# Patient Record
Sex: Male | Born: 1940
Health system: Southern US, Community
[De-identification: ages and names within clinical notes are randomized; demographics above are authoritative.]

## PROBLEM LIST (undated history)

## (undated) DIAGNOSIS — E119 Type 2 diabetes mellitus without complications: Secondary | ICD-10-CM

## (undated) DIAGNOSIS — I10 Essential (primary) hypertension: Secondary | ICD-10-CM

## (undated) DIAGNOSIS — Z8719 Personal history of other diseases of the digestive system: Secondary | ICD-10-CM

## (undated) DIAGNOSIS — Z8711 Personal history of peptic ulcer disease: Secondary | ICD-10-CM

## (undated) HISTORY — DX: Type 2 diabetes mellitus without complications: E11.9

## (undated) HISTORY — DX: Personal history of other diseases of the digestive system: Z87.19

## (undated) HISTORY — DX: Essential (primary) hypertension: I10

## (undated) HISTORY — DX: Personal history of peptic ulcer disease: Z87.11

---

## 2005-05-11 ENCOUNTER — Ambulatory Visit: Payer: Self-pay | Admitting: Family Medicine

## 2005-06-08 ENCOUNTER — Ambulatory Visit: Payer: Self-pay | Admitting: Family Medicine

## 2005-07-12 ENCOUNTER — Ambulatory Visit: Payer: Self-pay | Admitting: Gastroenterology

## 2005-07-26 ENCOUNTER — Encounter (INDEPENDENT_AMBULATORY_CARE_PROVIDER_SITE_OTHER): Payer: Self-pay | Admitting: *Deleted

## 2005-07-26 ENCOUNTER — Ambulatory Visit: Payer: Self-pay | Admitting: Gastroenterology

## 2007-10-19 ENCOUNTER — Encounter: Payer: Self-pay | Admitting: Family Medicine

## 2008-06-07 ENCOUNTER — Ambulatory Visit: Payer: Self-pay | Admitting: Family Medicine

## 2008-06-07 DIAGNOSIS — I10 Essential (primary) hypertension: Secondary | ICD-10-CM | POA: Insufficient documentation

## 2008-06-07 DIAGNOSIS — Z8711 Personal history of peptic ulcer disease: Secondary | ICD-10-CM

## 2008-06-10 ENCOUNTER — Telehealth: Payer: Self-pay | Admitting: Family Medicine

## 2008-06-10 LAB — CONVERTED CEMR LAB
Albumin: 4 g/dL (ref 3.5–5.2)
BUN: 13 mg/dL (ref 6–23)
Basophils Relative: 0.4 % (ref 0.0–3.0)
Calcium: 9.6 mg/dL (ref 8.4–10.5)
Creatinine, Ser: 1.1 mg/dL (ref 0.4–1.5)
Eosinophils Absolute: 0.1 10*3/uL (ref 0.0–0.7)
Eosinophils Relative: 1.9 % (ref 0.0–5.0)
GFR calc Af Amer: 86 mL/min
GFR calc non Af Amer: 71 mL/min
Glucose, Bld: 231 mg/dL — ABNORMAL HIGH (ref 70–99)
HCT: 44.8 % (ref 39.0–52.0)
Hemoglobin: 15 g/dL (ref 13.0–17.0)
Hgb A1c MFr Bld: 9.3 % — ABNORMAL HIGH (ref 4.6–6.0)
MCV: 88.1 fL (ref 78.0–100.0)
Monocytes Absolute: 0.6 10*3/uL (ref 0.1–1.0)
Monocytes Relative: 8.8 % (ref 3.0–12.0)
Neutro Abs: 3.8 10*3/uL (ref 1.4–7.7)
Platelets: 284 10*3/uL (ref 150–400)
Potassium: 4.4 meq/L (ref 3.5–5.1)
TSH: 1.03 microintl units/mL (ref 0.35–5.50)
Total Protein: 7.3 g/dL (ref 6.0–8.3)
WBC: 6.8 10*3/uL (ref 4.5–10.5)

## 2008-06-27 ENCOUNTER — Encounter (INDEPENDENT_AMBULATORY_CARE_PROVIDER_SITE_OTHER): Payer: Self-pay | Admitting: *Deleted

## 2008-07-16 ENCOUNTER — Ambulatory Visit: Payer: Self-pay | Admitting: Family Medicine

## 2008-07-17 LAB — CONVERTED CEMR LAB
ALT: 14 units/L (ref 0–53)
BUN: 11 mg/dL (ref 6–23)
Basophils Relative: 0.2 % (ref 0.0–3.0)
CO2: 30 meq/L (ref 19–32)
Chloride: 105 meq/L (ref 96–112)
Cholesterol: 177 mg/dL (ref 0–200)
Creatinine, Ser: 1 mg/dL (ref 0.4–1.5)
Eosinophils Absolute: 0 10*3/uL (ref 0.0–0.7)
Eosinophils Relative: 0.4 % (ref 0.0–5.0)
HCT: 43.9 % (ref 39.0–52.0)
LDL Cholesterol: 123 mg/dL — ABNORMAL HIGH (ref 0–99)
Lymphs Abs: 1.6 10*3/uL (ref 0.7–4.0)
MCHC: 33.5 g/dL (ref 30.0–36.0)
MCV: 89.3 fL (ref 78.0–100.0)
Microalb, Ur: 1.8 mg/dL (ref 0.0–1.9)
Monocytes Absolute: 0.4 10*3/uL (ref 0.1–1.0)
Neutrophils Relative %: 62.6 % (ref 43.0–77.0)
PSA: 2.15 ng/mL (ref 0.10–4.00)
Platelets: 277 10*3/uL (ref 150.0–400.0)
Potassium: 3.9 meq/L (ref 3.5–5.1)
RBC: 4.92 M/uL (ref 4.22–5.81)
TSH: 1.11 microintl units/mL (ref 0.35–5.50)
Total Protein: 7.6 g/dL (ref 6.0–8.3)
Triglycerides: 71 mg/dL (ref 0.0–149.0)
WBC: 5.6 10*3/uL (ref 4.5–10.5)

## 2008-09-04 ENCOUNTER — Encounter: Payer: Self-pay | Admitting: Family Medicine

## 2008-09-25 ENCOUNTER — Ambulatory Visit: Payer: Self-pay | Admitting: Family Medicine

## 2008-12-25 ENCOUNTER — Ambulatory Visit: Payer: Self-pay | Admitting: Family Medicine

## 2008-12-26 LAB — CONVERTED CEMR LAB
Creatinine,U: 154.5 mg/dL
Microalb Creat Ratio: 7.8 mg/g (ref 0.0–30.0)

## 2009-07-17 ENCOUNTER — Ambulatory Visit: Payer: Self-pay | Admitting: Family Medicine

## 2009-07-18 ENCOUNTER — Encounter (INDEPENDENT_AMBULATORY_CARE_PROVIDER_SITE_OTHER): Payer: Self-pay

## 2009-07-18 LAB — CONVERTED CEMR LAB
AST: 30 units/L (ref 0–37)
BUN: 10 mg/dL (ref 6–23)
Basophils Absolute: 0 10*3/uL (ref 0.0–0.1)
Bilirubin, Direct: 0 mg/dL (ref 0.0–0.3)
Calcium: 9.3 mg/dL (ref 8.4–10.5)
Cholesterol: 205 mg/dL — ABNORMAL HIGH (ref 0–200)
Creatinine, Ser: 1 mg/dL (ref 0.4–1.5)
GFR calc non Af Amer: 95.05 mL/min (ref >60)
Glucose, Bld: 131 mg/dL — ABNORMAL HIGH (ref 70–99)
HCT: 41.3 % (ref 39.0–52.0)
HDL: 52.1 mg/dL (ref >39.00)
Hgb A1c MFr Bld: 6.3 % (ref 4.6–6.5)
Lymphocytes Relative: 37.7 % (ref 12.0–46.0)
Lymphs Abs: 2.1 10*3/uL (ref 0.7–4.0)
Microalb Creat Ratio: 10.8 mg/g (ref 0.0–30.0)
Monocytes Relative: 10.9 % (ref 3.0–12.0)
Neutrophils Relative %: 50 % (ref 43.0–77.0)
Platelets: 282 10*3/uL (ref 150.0–400.0)
RDW: 14.1 % (ref 11.5–14.6)
TSH: 0.74 microintl units/mL (ref 0.35–5.50)
Total Bilirubin: 0.7 mg/dL (ref 0.3–1.2)
Triglycerides: 108 mg/dL (ref 0.0–149.0)
VLDL: 21.6 mg/dL (ref 0.0–40.0)

## 2009-10-09 ENCOUNTER — Ambulatory Visit: Payer: Self-pay | Admitting: Family Medicine

## 2009-10-09 DIAGNOSIS — M25569 Pain in unspecified knee: Secondary | ICD-10-CM

## 2009-12-31 ENCOUNTER — Encounter: Payer: Self-pay | Admitting: Family Medicine

## 2010-01-21 ENCOUNTER — Encounter: Payer: Self-pay | Admitting: Family Medicine

## 2010-05-26 NOTE — Miscellaneous (Signed)
Summary: Rx  Clinical Lists Changes  Medications: Added new medication of ZOCOR 40 MG TABS (SIMVASTATIN) 1 once daily - Signed Rx of ZOCOR 40 MG TABS (SIMVASTATIN) 1 once daily;  #90 x 0;  Signed;  Entered by: Raechel Ache, RN;  Authorized by: Nelwyn Salisbury MD;  Method used: Print then Give to Patient    Prescriptions: ZOCOR 40 MG TABS (SIMVASTATIN) 1 once daily  #90 x 0   Entered by:   Raechel Ache, RN   Authorized by:   Nelwyn Salisbury MD   Signed by:   Raechel Ache, RN on 07/18/2009   Method used:   Print then Give to Patient   RxID:   [id number]  mailed to patient- unable to reach by phone.

## 2010-05-26 NOTE — Letter (Signed)
Summary: Request for change in Diabetic Testing Regimen  Request for change in Diabetic Testing Regimen   Imported By: Maryln Gottron 01/06/2010 13:03:57  _____________________________________________________________________  External Attachment:    Type:   Image     Comment:   External Document

## 2010-05-26 NOTE — Medication Information (Signed)
Summary: Order for Diabetic Testing Supplies  Order for Diabetic Testing Supplies   Imported By: Maryln Gottron 01/22/2010 13:33:15  _____________________________________________________________________  External Attachment:    Type:   Image     Comment:   External Document

## 2010-05-26 NOTE — Medication Information (Signed)
Summary: Order for Diabetic Testing Supplies  Order for Diabetic Testing Supplies   Imported By: Maryln Gottron 10/13/2009 13:56:12  _____________________________________________________________________  External Attachment:    Type:   Image     Comment:   External Document

## 2010-05-26 NOTE — Assessment & Plan Note (Signed)
Summary: emp/pt fasting/cjr   Vital Signs:  Patient profile:   70 year old male Weight:      174 pounds BMI:     25.42 Pulse rate:   84 / minute Pulse rhythm:   regular BP sitting:   180 / 90  (left arm) Cuff size:   regular  Vitals Entered By: Raechel Ache, RN (July 17, 2009 9:20 AM) CC: OV, fasting. Brought list of sugars- wants to get off Metformin. Is Patient Diabetic? Yes   CC:  OV and fasting. Brought list of sugars- wants to get off Metformin.Marland Kitchen  History of Present Illness: 70 yr old male for a cpx. He feels great and has no concerns. Still working at the [employer] His BP at home is steady in the range of 110-120 over 70-80. His am fasting glucoses are always from 90 to 110. The Metformin causes his stomach to burn at times, and he would like to stop this for awhile to see what happens.   Allergies: 1)  ! Penicillin  Past History:  Past Medical History: Reviewed history from 06/07/2008 and no changes required. Diabetes mellitus, type II Hypertension Stomach ulcers  Past Surgical History: Reviewed history from 07/16/2008 and no changes required. Denies surgical history colonoscopy 07-26-05 per Dr. Jarold Motto, repeat in 5  years  Family History: Reviewed history from 06/07/2008 and no changes required. Family History of CAD Male 1st degree relative <50 Family History of Colon CA 1st degree relative <60 Family History Diabetes 1st degree relative Family History High cholesterol Family History Hypertension Family History of Stroke M 1st degree relative <50  Social History: Reviewed history from 06/07/2008 and no changes required. Married Former smoker Alcohol use-no Drug use-no  Review of Systems  The patient denies anorexia, fever, weight loss, weight gain, vision loss, decreased hearing, hoarseness, chest pain, syncope, dyspnea on exertion, peripheral edema, prolonged cough, headaches, hemoptysis, abdominal pain, melena, hematochezia, severe  indigestion/heartburn, hematuria, incontinence, genital sores, muscle weakness, suspicious skin lesions, transient blindness, difficulty walking, depression, unusual weight change, abnormal bleeding, enlarged lymph nodes, angioedema, breast masses, and testicular masses.    Physical Exam  General:  Well-developed,well-nourished,in no acute distress; alert,appropriate and cooperative throughout examination Head:  Normocephalic and atraumatic without obvious abnormalities. No apparent alopecia or balding. Eyes:  No corneal or conjunctival inflammation noted. EOMI. Perrla. Funduscopic exam benign, without hemorrhages, exudates or papilledema. Vision grossly normal. Ears:  External ear exam shows no significant lesions or deformities.  Otoscopic examination reveals clear canals, tympanic membranes are intact bilaterally without bulging, retraction, inflammation or discharge. Hearing is grossly normal bilaterally. Nose:  External nasal examination shows no deformity or inflammation. Nasal mucosa are pink and moist without lesions or exudates. Mouth:  Oral mucosa and oropharynx without lesions or exudates.  Teeth in good repair. Neck:  No deformities, masses, or tenderness noted. Chest Wall:  No deformities, masses, tenderness or gynecomastia noted. Lungs:  Normal respiratory effort, chest expands symmetrically. Lungs are clear to auscultation, no crackles or wheezes. Heart:  Normal rate and regular rhythm. S1 and S2 normal without gallop, murmur, click, rub or other extra sounds. EKG normal Abdomen:  Bowel sounds positive,abdomen soft and non-tender without masses, organomegaly or hernias noted. Rectal:  No external abnormalities noted. Normal sphincter tone. No rectal masses or tenderness. Heme neg. Genitalia:  Testes bilaterally descended without nodularity, tenderness or masses. No scrotal masses or lesions. No penis lesions or urethral discharge. Prostate:  Prostate gland firm and smooth, no  enlargement, nodularity, tenderness,  mass, asymmetry or induration. Msk:  No deformity or scoliosis noted of thoracic or lumbar spine.   Pulses:  R and L carotid,radial,femoral,dorsalis pedis and posterior tibial pulses are full and equal bilaterally Extremities:  No clubbing, cyanosis, edema, or deformity noted with normal full range of motion of all joints.   Neurologic:  No cranial nerve deficits noted. Station and gait are normal. Plantar reflexes are down-going bilaterally. DTRs are symmetrical throughout. Sensory, motor and coordinative functions appear intact. Skin:  Intact without suspicious lesions or rashes Cervical Nodes:  No lymphadenopathy noted Axillary Nodes:  No palpable lymphadenopathy Inguinal Nodes:  No significant adenopathy Psych:  Cognition and judgment appear intact. Alert and cooperative with normal attention span and concentration. No apparent delusions, illusions, hallucinations   Impression & Recommendations:  Problem # 1:  WELL ADULT EXAM (ICD-V70.0)  Orders: Hemoccult Guaiac-1 spec.(in office) (82270) UA Dipstick w/o Micro (automated)  (81003) EKG w/ Interpretation (93000) Venipuncture (04540) TLB-Lipid Panel (80061-LIPID) TLB-BMP (Basic Metabolic Panel-BMET) (80048-METABOL) TLB-CBC Platelet - w/Differential (85025-CBCD) TLB-Hepatic/Liver Function Pnl (80076-HEPATIC) TLB-TSH (Thyroid Stimulating Hormone) (84443-TSH) TLB-PSA (Prostate Specific Antigen) (84153-PSA)  Complete Medication List: 1)  Metformin Hcl 500 Mg Tabs (Metformin hcl) .... Take 1 tab twice daily 2)  Bayer Contour Test Strp (Glucose blood) .... Once daily 3)  Freestyle Lancets Misc (Lancets) .... Once daily  Other Orders: Pneumococcal Vaccine (98119) Admin 1st Vaccine (14782) TLB-Microalbumin/Creat Ratio, Urine (82043-MALB) TLB-A1C / Hgb A1C (Glycohemoglobin) (83036-A1C)  Patient Instructions: 1)  get labs. We will stop the Metformin for one month. He will watch his glucoses closely  and let me know how they do.   Immunizations Administered:  Pneumonia Vaccine:    Vaccine Type: Pneumovax    Site: left deltoid    Mfr: Merck    Dose: 0.5 ml    Route: IM    Given by: Raechel Ache, RN    Exp. Date: 08/14/2010    Lot #: [id number]    VIS given: 11/22/95 version given July 17, 2009.   Appended Document: Orders Update    Clinical Lists Changes  Observations: Added new observation of COMMENTS: Wynona Canes, CMA  July 17, 2009 2:07 PM (07/17/2009 14:06) Added new observation of PH URINE: 5.5  (07/17/2009 14:06) Added new observation of SPEC GR URIN: 1.015  (07/17/2009 14:06) Added new observation of APPEARANCE U: Clear  (07/17/2009 14:06) Added new observation of UA COLOR: yellow  (07/17/2009 14:06) Added new observation of WBC DIPSTK U: negative  (07/17/2009 14:06) Added new observation of NITRITE URN: negative  (07/17/2009 14:06) Added new observation of UROBILINOGEN: negative  (07/17/2009 14:06) Added new observation of PROTEIN, URN: negative  (07/17/2009 14:06) Added new observation of BLOOD UR DIP: negative  (07/17/2009 14:06) Added new observation of KETONES URN: negative  (07/17/2009 14:06) Added new observation of BILIRUBIN UR: negative  (07/17/2009 14:06) Added new observation of GLUCOSE, URN: negative  (07/17/2009 14:06)      Laboratory Results   Urine Tests  Date/Time Recieved: July 17, 2009 2:07 PM Date/Time Reported: July 17, 2009 2:07 PM  Routine Urinalysis   Color: yellow Appearance: Clear Glucose: negative   (Normal Range: Negative) Bilirubin: negative   (Normal Range: Negative) Ketone: negative   (Normal Range: Negative) Spec. Gravity: 1.015   (Normal Range: 1.003-1.035) Blood: negative   (Normal Range: Negative) pH: 5.5   (Normal Range: 5.0-8.0) Protein: negative   (Normal Range: Negative) Urobilinogen: negative   (Normal Range: 0-1) Nitrite: negative   (Normal Range: Negative) Leukocyte Esterace: negative   (  Normal Range:  Negative)    Comments: Wynona Canes, CMA  July 17, 2009 2:07 PM

## 2010-05-26 NOTE — Assessment & Plan Note (Signed)
Summary: swollen knee/dm   Vital Signs:  Patient profile:   70 year old male Weight:      166 pounds BMI:     24.25 Temp:     98.2 degrees F oral BP sitting:   140 / 84  (left arm) Cuff size:   regular  Vitals Entered By: Raechel Ache, RN (October 09, 2009 9:34 AM) CC: C/o R knee pain & swelling.   History of Present Illness: Here for 2 weeks of swelling and pain in the right knee. No hx of trauma. Advil and ice packs help a little. He usually does not have trouble with any of his joints. He has been following his glucoses closely since stopping his Metformin a few months ago, and they are steady in the range of 110-120 fasting.   Allergies: 1)  ! Penicillin  Past History:  Past Medical History: Reviewed history from 06/07/2008 and no changes required. Diabetes mellitus, type II Hypertension Stomach ulcers  Past Surgical History: Reviewed history from 07/16/2008 and no changes required. Denies surgical history colonoscopy 07-26-05 per Dr. Jarold Motto, repeat in 5  years  Review of Systems  The patient denies anorexia, fever, weight loss, weight gain, vision loss, decreased hearing, hoarseness, chest pain, syncope, dyspnea on exertion, peripheral edema, prolonged cough, headaches, hemoptysis, abdominal pain, melena, hematochezia, severe indigestion/heartburn, hematuria, incontinence, genital sores, muscle weakness, suspicious skin lesions, transient blindness, difficulty walking, depression, unusual weight change, abnormal bleeding, enlarged lymph nodes, angioedema, breast masses, and testicular masses.    Physical Exam  General:  Well-developed,well-nourished,in no acute distress; alert,appropriate and cooperative throughout examination Lungs:  Normal respiratory effort, chest expands symmetrically. Lungs are clear to auscultation, no crackles or wheezes. Heart:  Normal rate and regular rhythm. S1 and S2 normal without gallop, murmur, click, rub or other extra  sounds. Extremities:  the right knee is mildly swollen and mildly tender in both joint spaces. ROM is full, no crepitus.    Impression & Recommendations:  Problem # 1:  KNEE PAIN (ICD-719.46)  His updated medication list for this problem includes:    Indomethacin 50 Mg Caps (Indomethacin) .Marland Kitchen... Three times a day as needed pain  Problem # 2:  HYPERTENSION (ICD-401.9)  Problem # 3:  DIABETES MELLITUS, TYPE II (ICD-250.00)  The following medications were removed from the medication list:    Metformin Hcl 500 Mg Tabs (Metformin hcl) .Marland Kitchen... Take 1 tab twice daily  Complete Medication List: 1)  Bayer Contour Test Strp (Glucose blood) .... Once daily 2)  Freestyle Lancets Misc (Lancets) .... Once daily 3)  Zocor 40 Mg Tabs (Simvastatin) .Marland Kitchen.. 1 once daily 4)  Indomethacin 50 Mg Caps (Indomethacin) .... Three times a day as needed pain  Patient Instructions: 1)  this may represent his first gout episode. Try Indomethacin.  2)  Please schedule a follow-up appointment as needed .  Prescriptions: INDOMETHACIN 50 MG CAPS (INDOMETHACIN) three times a day as needed pain  #60 x 5   Entered and Authorized by:   Nelwyn Salisbury MD   Signed by:   Nelwyn Salisbury MD on 10/09/2009   Method used:   Electronically to        [facility] [address] [phone]* (retail)       [address]       [place], [place]  [address]       Ph: [phone] or [phone]       Fax: [phone]  RxID:   [id number]

## 2010-09-17 ENCOUNTER — Encounter: Payer: Self-pay | Admitting: Gastroenterology

## 2012-01-12 ENCOUNTER — Encounter: Payer: Self-pay | Admitting: Gastroenterology

## 2012-04-04 ENCOUNTER — Ambulatory Visit (INDEPENDENT_AMBULATORY_CARE_PROVIDER_SITE_OTHER): Payer: Medicare Other | Admitting: Family Medicine

## 2012-04-04 ENCOUNTER — Encounter: Payer: Self-pay | Admitting: Family Medicine

## 2012-04-04 VITALS — BP 152/100 | HR 107 | Temp 98.3°F | Ht 69.5 in | Wt 170.0 lb

## 2012-04-04 DIAGNOSIS — Z23 Encounter for immunization: Secondary | ICD-10-CM

## 2012-04-04 DIAGNOSIS — E119 Type 2 diabetes mellitus without complications: Secondary | ICD-10-CM

## 2012-04-04 DIAGNOSIS — I10 Essential (primary) hypertension: Secondary | ICD-10-CM

## 2012-04-04 DIAGNOSIS — Z0000 Encounter for general adult medical examination without abnormal findings: Secondary | ICD-10-CM

## 2012-04-04 LAB — BASIC METABOLIC PANEL
CO2: 29 mEq/L (ref 19–32)
Chloride: 101 mEq/L (ref 96–112)
Sodium: 138 mEq/L (ref 135–145)

## 2012-04-04 LAB — CBC WITH DIFFERENTIAL/PLATELET
Basophils Absolute: 0 10*3/uL (ref 0.0–0.1)
Eosinophils Absolute: 0 10*3/uL (ref 0.0–0.7)
Lymphocytes Relative: 35.4 % (ref 12.0–46.0)
MCHC: 32.3 g/dL (ref 30.0–36.0)
Neutrophils Relative %: 54.9 % (ref 43.0–77.0)
RDW: 14.7 % — ABNORMAL HIGH (ref 11.5–14.6)

## 2012-04-04 LAB — HEPATIC FUNCTION PANEL
ALT: 19 U/L (ref 0–53)
AST: 25 U/L (ref 0–37)
Albumin: 4.2 g/dL (ref 3.5–5.2)

## 2012-04-04 LAB — LIPID PANEL
Cholesterol: 227 mg/dL — ABNORMAL HIGH (ref 0–200)
Total CHOL/HDL Ratio: 5

## 2012-04-04 LAB — LDL CHOLESTEROL, DIRECT: Direct LDL: 167.6 mg/dL

## 2012-04-04 NOTE — Addendum Note (Signed)
Addended by: Aniceto Boss A on: 04/04/2012 03:51 PM   Modules accepted: Orders

## 2012-04-04 NOTE — Progress Notes (Signed)
  Subjective:    Patient ID: Jose Fleming, male    DOB: 05-28-1940, 71 y.o.   MRN: [id number]  HPI 71 yr old male for a cpx. He feels well and has no concerns. He retired earlier this year, and he is enjoying his free time and the lack of stress in his life. He checks his glucoses at home in the mornings, and he usually gets in the 120s or 130s. His wife checks his BP at home, and they always get readings in the 130s over 80s.    Review of Systems  Constitutional: Negative.   HENT: Negative.   Eyes: Negative.   Respiratory: Negative.   Cardiovascular: Negative.   Gastrointestinal: Negative.   Genitourinary: Negative.   Musculoskeletal: Negative.   Skin: Negative.   Neurological: Negative.   Hematological: Negative.   Psychiatric/Behavioral: Negative.        Objective:   Physical Exam  Constitutional: He is oriented to person, place, and time. He appears well-developed and well-nourished. No distress.  HENT:  Head: Normocephalic and atraumatic.  Right Ear: External ear normal.  Left Ear: External ear normal.  Nose: Nose normal.  Mouth/Throat: Oropharynx is clear and moist. No oropharyngeal exudate.  Eyes: Conjunctivae normal and EOM are normal. Pupils are equal, round, and reactive to light. Right eye exhibits no discharge. Left eye exhibits no discharge. No scleral icterus.  Neck: Neck supple. No JVD present. No tracheal deviation present. No thyromegaly present.  Cardiovascular: Normal rate, regular rhythm, normal heart sounds and intact distal pulses.  Exam reveals no gallop and no friction rub.   No murmur heard.      EKG is at his baseline with incomplete RBBB  Pulmonary/Chest: Effort normal and breath sounds normal. No respiratory distress. He has no wheezes. He has no rales. He exhibits no tenderness.  Abdominal: Soft. Bowel sounds are normal. He exhibits no distension and no mass. There is no tenderness. There is no rebound and no guarding.  Genitourinary: Rectum normal,  prostate normal and penis normal. Guaiac negative stool. No penile tenderness.  Musculoskeletal: Normal range of motion. He exhibits no edema and no tenderness.  Lymphadenopathy:    He has no cervical adenopathy.  Neurological: He is alert and oriented to person, place, and time. He has normal reflexes. No cranial nerve deficit. He exhibits normal muscle tone. Coordination normal.  Skin: Skin is warm and dry. No rash noted. He is not diaphoretic. No erythema. No pallor.  Psychiatric: He has a normal mood and affect. His behavior is normal. Judgment and thought content normal.          Assessment & Plan:  Well exam. Get fasting labs. He is past due for a colonoscopy so we will set this up.

## 2012-04-05 LAB — POCT URINALYSIS DIPSTICK
Bilirubin, UA: NEGATIVE
Leukocytes, UA: NEGATIVE
Nitrite, UA: NEGATIVE
Protein, UA: NEGATIVE
pH, UA: 7

## 2012-04-05 LAB — MICROALBUMIN / CREATININE URINE RATIO: Microalb, Ur: 0.3 mg/dL (ref 0.0–1.9)

## 2012-04-07 NOTE — Progress Notes (Signed)
Quick Note:  I spoke with pt ______ 

## 2012-05-31 ENCOUNTER — Encounter: Payer: Self-pay | Admitting: Family Medicine

## 2012-05-31 ENCOUNTER — Other Ambulatory Visit: Payer: Self-pay | Admitting: Family Medicine

## 2012-05-31 ENCOUNTER — Ambulatory Visit (INDEPENDENT_AMBULATORY_CARE_PROVIDER_SITE_OTHER): Payer: Medicare Other | Admitting: Family Medicine

## 2012-05-31 VITALS — BP 174/90 | HR 104 | Temp 98.3°F | Wt 178.0 lb

## 2012-05-31 DIAGNOSIS — L989 Disorder of the skin and subcutaneous tissue, unspecified: Secondary | ICD-10-CM

## 2012-05-31 DIAGNOSIS — L919 Hypertrophic disorder of the skin, unspecified: Secondary | ICD-10-CM

## 2012-05-31 DIAGNOSIS — D485 Neoplasm of uncertain behavior of skin: Secondary | ICD-10-CM

## 2012-05-31 DIAGNOSIS — L918 Other hypertrophic disorders of the skin: Secondary | ICD-10-CM

## 2012-06-01 ENCOUNTER — Encounter: Payer: Self-pay | Admitting: Family Medicine

## 2012-06-01 NOTE — Progress Notes (Signed)
  Subjective:    Patient ID: Jose Fleming, male    DOB: 09-25-1940, 72 y.o.   MRN: [id number]  HPI Here for removal of a large fleshy lesion from the left middle back and to remove a small skin tag from the left lower back.    Review of Systems  Constitutional: Negative.        Objective:   Physical Exam  Constitutional: He appears well-developed and well-nourished.  Skin:       Large fleshy pedunculated lesion on the left middle back. Diameter is 1.3 cm. Small tag on the left lower back           Assessment & Plan:  Both areas were cleansed with betadine. We first dealt with the larger lesion. LA was achieved around this with 2% Xylocaine with epinephrine. An elliptical incision was made around this with a scalpel down to the middle skin layer. The lesion was removed and sent to Pathology. The wound was closed with 3 sutures of 4-0 Ethilon. Then the smaller tag was removed using scissors. Both wounds were dressed with Neosporin and Bandaids. The sutures are to be removed in 14 days.

## 2012-06-02 NOTE — Progress Notes (Signed)
Quick Note:  Pt informed ______ 

## 2012-06-25 ENCOUNTER — Emergency Department (HOSPITAL_COMMUNITY): Payer: No Typology Code available for payment source

## 2012-06-25 ENCOUNTER — Encounter (HOSPITAL_COMMUNITY): Payer: Self-pay | Admitting: Physical Medicine and Rehabilitation

## 2012-06-25 ENCOUNTER — Emergency Department (HOSPITAL_COMMUNITY)
Admission: EM | Admit: 2012-06-25 | Discharge: 2012-06-25 | Disposition: A | Discharge status: Home / Self Care | Payer: No Typology Code available for payment source | Attending: Emergency Medicine | Admitting: Emergency Medicine

## 2012-06-25 DIAGNOSIS — Y9389 Activity, other specified: Secondary | ICD-10-CM | POA: Insufficient documentation

## 2012-06-25 DIAGNOSIS — IMO0002 Reserved for concepts with insufficient information to code with codable children: Secondary | ICD-10-CM | POA: Insufficient documentation

## 2012-06-25 DIAGNOSIS — S298XXA Other specified injuries of thorax, initial encounter: Secondary | ICD-10-CM | POA: Insufficient documentation

## 2012-06-25 DIAGNOSIS — I10 Essential (primary) hypertension: Secondary | ICD-10-CM | POA: Insufficient documentation

## 2012-06-25 DIAGNOSIS — S0993XA Unspecified injury of face, initial encounter: Secondary | ICD-10-CM | POA: Insufficient documentation

## 2012-06-25 DIAGNOSIS — T148XXA Other injury of unspecified body region, initial encounter: Secondary | ICD-10-CM | POA: Insufficient documentation

## 2012-06-25 DIAGNOSIS — S4980XA Other specified injuries of shoulder and upper arm, unspecified arm, initial encounter: Secondary | ICD-10-CM | POA: Insufficient documentation

## 2012-06-25 DIAGNOSIS — Y92410 Unspecified street and highway as the place of occurrence of the external cause: Secondary | ICD-10-CM | POA: Insufficient documentation

## 2012-06-25 DIAGNOSIS — E119 Type 2 diabetes mellitus without complications: Secondary | ICD-10-CM | POA: Insufficient documentation

## 2012-06-25 DIAGNOSIS — S46909A Unspecified injury of unspecified muscle, fascia and tendon at shoulder and upper arm level, unspecified arm, initial encounter: Secondary | ICD-10-CM | POA: Insufficient documentation

## 2012-06-25 DIAGNOSIS — Z8719 Personal history of other diseases of the digestive system: Secondary | ICD-10-CM | POA: Insufficient documentation

## 2012-06-25 MED ORDER — CYCLOBENZAPRINE HCL 5 MG PO TABS: 5.0000 mg | ORAL_TABLET | Freq: Three times a day (TID) | ORAL | Status: DC | PRN

## 2012-06-25 MED ORDER — HYDROCODONE-ACETAMINOPHEN 5-325 MG PO TABS
1.0000 | ORAL_TABLET | Freq: Once | ORAL | Status: AC
  Administered 2012-06-25: 1 via ORAL
  Filled 2012-06-25: qty 1

## 2012-06-25 MED ORDER — HYDROCODONE-ACETAMINOPHEN 5-325 MG PO TABS: 1.0000 | ORAL_TABLET | ORAL | Status: DC | PRN

## 2012-06-25 MED ORDER — CYCLOBENZAPRINE HCL 10 MG PO TABS
5.0000 mg | ORAL_TABLET | Freq: Once | ORAL | Status: AC
  Administered 2012-06-25: 10 mg via ORAL
  Filled 2012-06-25: qty 1
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
## 2012-06-25 NOTE — ED Notes (Signed)
Pt moved to exam room 19, report given to [place], [place].
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
## 2012-06-29 ENCOUNTER — Ambulatory Visit (INDEPENDENT_AMBULATORY_CARE_PROVIDER_SITE_OTHER): Payer: Medicare Other | Admitting: Family Medicine

## 2012-06-29 ENCOUNTER — Encounter: Payer: Self-pay | Admitting: Family Medicine

## 2012-06-29 VITALS — BP 170/90 | HR 116 | Temp 98.6°F | Wt 176.0 lb

## 2012-06-29 DIAGNOSIS — S161XXD Strain of muscle, fascia and tendon at neck level, subsequent encounter: Secondary | ICD-10-CM

## 2012-06-29 DIAGNOSIS — Z5189 Encounter for other specified aftercare: Secondary | ICD-10-CM

## 2012-06-29 MED ORDER — CYCLOBENZAPRINE HCL 5 MG PO TABS: 5.0000 mg | ORAL_TABLET | Freq: Three times a day (TID) | ORAL | Status: DC | PRN

## 2012-06-29 MED ORDER — HYDROCODONE-ACETAMINOPHEN 5-325 MG PO TABS: 1.0000 | ORAL_TABLET | Freq: Four times a day (QID) | ORAL | Status: DC | PRN
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
## 2012-07-11 ENCOUNTER — Ambulatory Visit: Payer: No Typology Code available for payment source | Admitting: Physical Therapy

## 2012-07-17 ENCOUNTER — Ambulatory Visit: Payer: No Typology Code available for payment source | Attending: Family Medicine | Admitting: Physical Therapy

## 2012-07-17 DIAGNOSIS — IMO0001 Reserved for inherently not codable concepts without codable children: Secondary | ICD-10-CM | POA: Insufficient documentation

## 2012-07-17 DIAGNOSIS — M25519 Pain in unspecified shoulder: Secondary | ICD-10-CM | POA: Insufficient documentation

## 2012-07-17 DIAGNOSIS — R293 Abnormal posture: Secondary | ICD-10-CM | POA: Insufficient documentation

## 2012-07-17 DIAGNOSIS — M542 Cervicalgia: Secondary | ICD-10-CM | POA: Insufficient documentation

## 2012-07-17 DIAGNOSIS — M2560 Stiffness of unspecified joint, not elsewhere classified: Secondary | ICD-10-CM | POA: Insufficient documentation

## 2012-07-17 DIAGNOSIS — M546 Pain in thoracic spine: Secondary | ICD-10-CM | POA: Insufficient documentation

## 2012-07-21 ENCOUNTER — Ambulatory Visit: Payer: No Typology Code available for payment source | Admitting: Rehabilitation

## 2012-07-24 ENCOUNTER — Ambulatory Visit: Payer: No Typology Code available for payment source | Admitting: Rehabilitation

## 2012-07-26 ENCOUNTER — Ambulatory Visit: Payer: No Typology Code available for payment source | Attending: Family Medicine | Admitting: Rehabilitation

## 2012-07-26 DIAGNOSIS — M542 Cervicalgia: Secondary | ICD-10-CM | POA: Insufficient documentation

## 2012-07-26 DIAGNOSIS — M25519 Pain in unspecified shoulder: Secondary | ICD-10-CM | POA: Insufficient documentation

## 2012-07-26 DIAGNOSIS — IMO0001 Reserved for inherently not codable concepts without codable children: Secondary | ICD-10-CM | POA: Insufficient documentation

## 2012-07-26 DIAGNOSIS — M2560 Stiffness of unspecified joint, not elsewhere classified: Secondary | ICD-10-CM | POA: Insufficient documentation

## 2012-07-26 DIAGNOSIS — R293 Abnormal posture: Secondary | ICD-10-CM | POA: Insufficient documentation

## 2012-07-26 DIAGNOSIS — M546 Pain in thoracic spine: Secondary | ICD-10-CM | POA: Insufficient documentation

## 2012-07-31 ENCOUNTER — Ambulatory Visit: Payer: No Typology Code available for payment source | Admitting: Physical Therapy

## 2012-08-02 ENCOUNTER — Ambulatory Visit: Payer: No Typology Code available for payment source | Admitting: Rehabilitation

## 2012-08-07 ENCOUNTER — Ambulatory Visit: Payer: No Typology Code available for payment source | Admitting: Rehabilitation

## 2012-08-09 ENCOUNTER — Ambulatory Visit: Payer: No Typology Code available for payment source | Admitting: Rehabilitation

## 2013-04-03 ENCOUNTER — Encounter: Payer: Self-pay | Admitting: Family Medicine

## 2013-04-03 ENCOUNTER — Ambulatory Visit (INDEPENDENT_AMBULATORY_CARE_PROVIDER_SITE_OTHER): Payer: Medicare Other | Admitting: Family Medicine

## 2013-04-03 ENCOUNTER — Encounter: Payer: Medicare Other | Admitting: Family Medicine

## 2013-04-03 VITALS — BP 158/84 | HR 116 | Temp 98.8°F | Ht 69.5 in | Wt 172.0 lb

## 2013-04-03 DIAGNOSIS — N138 Other obstructive and reflux uropathy: Secondary | ICD-10-CM

## 2013-04-03 DIAGNOSIS — N401 Enlarged prostate with lower urinary tract symptoms: Secondary | ICD-10-CM

## 2013-04-03 DIAGNOSIS — E119 Type 2 diabetes mellitus without complications: Secondary | ICD-10-CM

## 2013-04-03 DIAGNOSIS — N139 Obstructive and reflux uropathy, unspecified: Secondary | ICD-10-CM

## 2013-04-03 DIAGNOSIS — Z23 Encounter for immunization: Secondary | ICD-10-CM

## 2013-04-03 DIAGNOSIS — Z0000 Encounter for general adult medical examination without abnormal findings: Secondary | ICD-10-CM

## 2013-04-03 DIAGNOSIS — I10 Essential (primary) hypertension: Secondary | ICD-10-CM

## 2013-04-03 LAB — BASIC METABOLIC PANEL
BUN: 13 mg/dL (ref 6–23)
Calcium: 9.3 mg/dL (ref 8.4–10.5)
Creatinine, Ser: 1.2 mg/dL (ref 0.4–1.5)
GFR: 75.48 mL/min (ref >60.00)
Glucose, Bld: 181 mg/dL — ABNORMAL HIGH (ref 70–99)

## 2013-04-03 LAB — HEPATIC FUNCTION PANEL
AST: 26 U/L (ref 0–37)
Albumin: 4.6 g/dL (ref 3.5–5.2)
Total Bilirubin: 0.8 mg/dL (ref 0.3–1.2)

## 2013-04-03 LAB — LIPID PANEL
Cholesterol: 241 mg/dL — ABNORMAL HIGH (ref 0–200)
HDL: 51.3 mg/dL (ref >39.00)
Triglycerides: 121 mg/dL (ref 0.0–149.0)
VLDL: 24.2 mg/dL (ref 0.0–40.0)

## 2013-04-03 LAB — CBC WITH DIFFERENTIAL/PLATELET
Basophils Absolute: 0 10*3/uL (ref 0.0–0.1)
Basophils Relative: 0.5 % (ref 0.0–3.0)
Eosinophils Absolute: 0 10*3/uL (ref 0.0–0.7)
Hemoglobin: 14.6 g/dL (ref 13.0–17.0)
Lymphocytes Relative: 29.2 % (ref 12.0–46.0)
MCHC: 33.2 g/dL (ref 30.0–36.0)
Monocytes Relative: 9.5 % (ref 3.0–12.0)
Neutrophils Relative %: 60.3 % (ref 43.0–77.0)
RBC: 4.98 Mil/uL (ref 4.22–5.81)
RDW: 15 % — ABNORMAL HIGH (ref 11.5–14.6)

## 2013-04-03 LAB — POCT URINALYSIS DIPSTICK
Bilirubin, UA: NEGATIVE
Ketones, UA: NEGATIVE
Leukocytes, UA: NEGATIVE
Nitrite, UA: NEGATIVE
pH, UA: 5.5

## 2013-04-03 LAB — TSH: TSH: 0.88 u[IU]/mL (ref 0.35–5.50)

## 2013-04-03 LAB — HEMOGLOBIN A1C: Hgb A1c MFr Bld: 8.1 % — ABNORMAL HIGH (ref 4.6–6.5)

## 2013-04-03 NOTE — Progress Notes (Signed)
   Subjective:    Patient ID: Jose Fleming, male    DOB: 05-03-1940, 72 y.o.   MRN: [id number]  HPI 72 yr old male for a cpx. He feels well.    Review of Systems  Constitutional: Negative.   HENT: Negative.   Eyes: Negative.   Respiratory: Negative.   Cardiovascular: Negative.   Gastrointestinal: Negative.   Genitourinary: Negative.   Musculoskeletal: Negative.   Skin: Negative.   Neurological: Negative.   Psychiatric/Behavioral: Negative.        Objective:   Physical Exam  Constitutional: He is oriented to person, place, and time. He appears well-developed and well-nourished. No distress.  HENT:  Head: Normocephalic and atraumatic.  Right Ear: External ear normal.  Left Ear: External ear normal.  Nose: Nose normal.  Mouth/Throat: Oropharynx is clear and moist. No oropharyngeal exudate.  Eyes: Conjunctivae and EOM are normal. Pupils are equal, round, and reactive to light. Right eye exhibits no discharge. Left eye exhibits no discharge. No scleral icterus.  Neck: Neck supple. No JVD present. No tracheal deviation present. No thyromegaly present.  Cardiovascular: Normal rate, regular rhythm, normal heart sounds and intact distal pulses.  Exam reveals no gallop and no friction rub.   No murmur heard. EKG is at baseline with incomplete RBBB  Pulmonary/Chest: Effort normal and breath sounds normal. No respiratory distress. He has no wheezes. He has no rales. He exhibits no tenderness.  Abdominal: Soft. Bowel sounds are normal. He exhibits no distension and no mass. There is no tenderness. There is no rebound and no guarding.  Genitourinary: Rectum normal, prostate normal and penis normal. Guaiac negative stool. No penile tenderness.  Musculoskeletal: Normal range of motion. He exhibits no edema and no tenderness.  Lymphadenopathy:    He has no cervical adenopathy.  Neurological: He is alert and oriented to person, place, and time. He has normal reflexes. No cranial nerve deficit.  He exhibits normal muscle tone. Coordination normal.  Skin: Skin is warm and dry. No rash noted. He is not diaphoretic. No erythema. No pallor.  Psychiatric: He has a normal mood and affect. His behavior is normal. Judgment and thought content normal.          Assessment & Plan:  Well exam. Get fasting labs. Set up a colonoscopy

## 2013-04-03 NOTE — Progress Notes (Signed)
Pre visit review using our clinic review tool, if applicable. No additional management support is needed unless otherwise documented below in the visit note. 

## 2013-04-04 ENCOUNTER — Encounter: Payer: Self-pay | Admitting: Gastroenterology

## 2013-04-04 LAB — LDL CHOLESTEROL, DIRECT: Direct LDL: 167.4 mg/dL

## 2013-04-10 MED ORDER — METFORMIN HCL 500 MG PO TABS: 500.0000 mg | ORAL_TABLET | Freq: Two times a day (BID) | ORAL | Status: DC

## 2013-04-10 NOTE — Addendum Note (Signed)
Addended by: Aniceto Boss A on: 04/10/2013 12:03 PM   Modules accepted: Orders

## 2013-04-24 ENCOUNTER — Ambulatory Visit (AMBULATORY_SURGERY_CENTER): Payer: Self-pay | Admitting: *Deleted

## 2013-04-24 VITALS — Ht 70.0 in | Wt 178.2 lb

## 2013-04-24 DIAGNOSIS — Z86010 Personal history of colonic polyps: Secondary | ICD-10-CM

## 2013-04-24 MED ORDER — MOVIPREP 100 G PO SOLR: ORAL | Status: DC

## 2013-04-24 NOTE — Progress Notes (Signed)
No allergies to eggs or soy. No problems with anesthesia.  

## 2013-05-04 ENCOUNTER — Telehealth: Payer: Self-pay | Admitting: Family Medicine

## 2013-05-04 NOTE — Telephone Encounter (Signed)
Pt has been getting supplies from [facility], pt is going to check with them to see if his insurance is going to cover his diabetic supplies.

## 2013-05-04 NOTE — Telephone Encounter (Signed)
Pt received a call from [facility] concerning diabetic supplies/ they you pt that his supplies had been turned over to the them ,that dr fry had ordered supplies from them and they would be sending out,  Pt would like to confirm that you did this. pls advise.

## 2013-05-07 ENCOUNTER — Ambulatory Visit (AMBULATORY_SURGERY_CENTER): Payer: Medicare Other | Admitting: Gastroenterology

## 2013-05-07 ENCOUNTER — Encounter: Payer: Self-pay | Admitting: Gastroenterology

## 2013-05-07 VITALS — BP 130/77 | HR 81 | Temp 98.4°F | Resp 15 | Ht 70.0 in | Wt 178.0 lb

## 2013-05-07 DIAGNOSIS — Z860100 Personal history of colon polyps, unspecified: Secondary | ICD-10-CM

## 2013-05-07 DIAGNOSIS — K5730 Diverticulosis of large intestine without perforation or abscess without bleeding: Secondary | ICD-10-CM

## 2013-05-07 DIAGNOSIS — D126 Benign neoplasm of colon, unspecified: Secondary | ICD-10-CM

## 2013-05-07 HISTORY — PX: COLONOSCOPY: SHX174

## 2013-05-07 LAB — GLUCOSE, CAPILLARY
GLUCOSE-CAPILLARY: 115 mg/dL — AB (ref 70–99)
Glucose-Capillary: 119 mg/dL — ABNORMAL HIGH (ref 70–99)

## 2013-05-07 MED ORDER — SODIUM CHLORIDE 0.9 % IV SOLN: 500.0000 mL | INTRAVENOUS | Status: DC

## 2013-05-07 NOTE — Progress Notes (Signed)
Pt stable to RR 

## 2013-05-07 NOTE — Progress Notes (Signed)
NO EGG OR SOY ALLERGY. EWM NO PROBLEMS WITH LAST SEDATION. EWM

## 2013-05-07 NOTE — Op Note (Signed)
[facility]  [address] [place], [id number]   COLONOSCOPY PROCEDURE REPORT  PATIENT: Jose, Fleming  MR#: [id number] BIRTHDATE: 13-Aug-1940 , 72  yrs. old GENDER: Male ENDOSCOPIST: Sable Feil, MD, [facility] REFERRED BY: PROCEDURE DATE:  05/07/2013 PROCEDURE:   Colonoscopy with snare polypectomy First Screening Colonoscopy - Avg.  risk and is 50 yrs.  old or older - No.      History of Adenoma - Now for follow-up colonoscopy & has been > or = to 3 yrs.  Yes hx of adenoma.  Has been 3 or more years since last colonoscopy.  Polyps Removed Today? Yes. ASA CLASS:   Class II INDICATIONS:Patient's personal history of adenomatous colon polyps.  MEDICATIONS: propofol (Diprivan) 250mg  IV  DESCRIPTION OF PROCEDURE:   After the risks benefits and alternatives of the procedure were thoroughly explained, informed consent was obtained.  A digital rectal exam revealed no abnormalities of the rectum.   The LB [id number]  endoscope was introduced through the anus and advanced to the cecum, which was identified by both the appendix and ileocecal valve. No adverse events experienced.   The quality of the prep was adequate.  The instrument was then slowly withdrawn as the colon was fully examined.      COLON FINDINGS: A smooth sessile polyp ranging between 3-36mm in size was found at the hepatic flexure.  A polypectomy was performed using snare cautery.  The resection was complete and the polyp tissue was completely retrieved.   A polypoid shaped sessile polyp ranging between 3-96mm in size was found in the transverse colon.  A polypectomy was performed with a cold snare.  The resection was complete and the polyp tissue was completely retrieved.   There was mild diverticulosis noted with associated colonic spasm and petechiae.  Retroflexed views revealed no abnormalities. The time to cecum=6 minutes 04 seconds.  Withdrawal time=13 minutes 09 seconds.  The scope was  withdrawn and the procedure completed. COMPLICATIONS: There were no complications.  ENDOSCOPIC IMPRESSION: 1.   Sessile polyp ranging between 3-21mm in size was found at the hepatic flexure; polypectomy was performed using snare cautery 2.   Sessile polyp ranging between 3-68mm in size was found in the transverse colon; polypectomy was performed with a cold snare 3.   There was mild diverticulosis noted  RECOMMENDATIONS: 1.  Repeat colonoscopy in 5 years if polyp adenomatous; otherwise 10 years 2.  Continue current medications 3.  High fiber diet with liberal fluid intake.   eSigned:  Sable Feil, MD, [facility] 05/07/2013 12:12 PM   cc:   PATIENT NAME:  Jose, Fleming MR#: [id number]

## 2013-05-07 NOTE — Progress Notes (Signed)
Called to room to assist during endoscopic procedure.  Patient ID and intended procedure confirmed with present staff. Received instructions for my participation in the procedure from the performing physician.  

## 2013-05-07 NOTE — Patient Instructions (Signed)
YOU HAD AN ENDOSCOPIC PROCEDURE TODAY AT THE [facility]: Refer to the procedure report that was given to you for any specific questions about what was found during the examination.  If the procedure report does not answer your questions, please call your gastroenterologist to clarify.  If you requested that your care partner not be given the details of your procedure findings, then the procedure report has been included in a sealed envelope for you to review at your convenience later.  YOU SHOULD EXPECT: Some feelings of bloating in the abdomen. Passage of more gas than usual.  Walking can help get rid of the air that was put into your GI tract during the procedure and reduce the bloating. If you had a lower endoscopy (such as a colonoscopy or flexible sigmoidoscopy) you may notice spotting of blood in your stool or on the toilet paper. If you underwent a bowel prep for your procedure, then you may not have a normal bowel movement for a few days.  DIET: Your first meal following the procedure should be a light meal and then it is ok to progress to your normal diet.  A half-sandwich or bowl of soup is an example of a good first meal.  Heavy or fried foods are harder to digest and may make you feel nauseous or bloated.  Likewise meals heavy in dairy and vegetables can cause extra gas to form and this can also increase the bloating.  Drink plenty of fluids but you should avoid alcoholic beverages for 24 hours.  ACTIVITY: Your care partner should take you home directly after the procedure.  You should plan to take it easy, moving slowly for the rest of the day.  You can resume normal activity the day after the procedure however you should NOT DRIVE or use heavy machinery for 24 hours (because of the sedation medicines used during the test).    SYMPTOMS TO REPORT IMMEDIATELY: A gastroenterologist can be reached at any hour.  During normal business hours, 8:30 AM to 5:00 PM Monday through Friday,  call [phone].  After hours and on weekends, please call the GI answering service at [phone] who will take a message and have the physician on call contact you.   Following lower endoscopy (colonoscopy or flexible sigmoidoscopy):  Excessive amounts of blood in the stool  Significant tenderness or worsening of abdominal pains  Swelling of the abdomen that is new, acute  Fever of 100F or higher   FOLLOW UP: If any biopsies were taken you will be contacted by phone or by letter within the next 1-3 weeks.  Call your gastroenterologist if you have not heard about the biopsies in 3 weeks.  Our staff will call the home number listed on your records the next business day following your procedure to check on you and address any questions or concerns that you may have at that time regarding the information given to you following your procedure. This is a courtesy call and so if there is no answer at the home number and we have not heard from you through the emergency physician on call, we will assume that you have returned to your regular daily activities without incident.  SIGNATURES/CONFIDENTIALITY: You and/or your care partner have signed paperwork which will be entered into your electronic medical record.  These signatures attest to the fact that that the information above on your After Visit Summary has been reviewed and is understood.  Full responsibility of the confidentiality of   this discharge information lies with you and/or your care-partner.   Resume medications. Information given on polyps,diverticulosis and high fiber diet with discharge instructions. 

## 2013-05-08 ENCOUNTER — Telehealth: Payer: Self-pay

## 2013-05-08 NOTE — Telephone Encounter (Signed)
No answer left message to call [facility] if needed following procedure Monday

## 2013-05-10 ENCOUNTER — Encounter: Payer: Self-pay | Admitting: Gastroenterology

## 2013-05-14 ENCOUNTER — Telehealth: Payer: Self-pay | Admitting: Family Medicine

## 2013-05-14 NOTE — Telephone Encounter (Signed)
I spoke with pt's wife and pt will be getting diabetic supplies from [facility].
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
## 2013-11-22 ENCOUNTER — Ambulatory Visit: Payer: Medicare Other | Admitting: Family Medicine

## 2014-01-16 ENCOUNTER — Ambulatory Visit (INDEPENDENT_AMBULATORY_CARE_PROVIDER_SITE_OTHER): Payer: Medicare Other | Admitting: Family Medicine

## 2014-01-16 ENCOUNTER — Encounter: Payer: Self-pay | Admitting: Family Medicine

## 2014-01-16 VITALS — BP 168/88 | HR 100 | Temp 97.9°F | Ht 70.0 in | Wt 175.0 lb

## 2014-01-16 DIAGNOSIS — Z125 Encounter for screening for malignant neoplasm of prostate: Secondary | ICD-10-CM

## 2014-01-16 DIAGNOSIS — Z0000 Encounter for general adult medical examination without abnormal findings: Secondary | ICD-10-CM

## 2014-01-16 DIAGNOSIS — R011 Cardiac murmur, unspecified: Secondary | ICD-10-CM

## 2014-01-16 DIAGNOSIS — Z23 Encounter for immunization: Secondary | ICD-10-CM

## 2014-01-16 DIAGNOSIS — Z79899 Other long term (current) drug therapy: Secondary | ICD-10-CM

## 2014-01-16 DIAGNOSIS — I10 Essential (primary) hypertension: Secondary | ICD-10-CM

## 2014-01-16 DIAGNOSIS — E119 Type 2 diabetes mellitus without complications: Secondary | ICD-10-CM

## 2014-01-16 LAB — POCT URINALYSIS DIPSTICK
BILIRUBIN UA: NEGATIVE
GLUCOSE UA: NEGATIVE
Ketones, UA: NEGATIVE
Leukocytes, UA: NEGATIVE
NITRITE UA: NEGATIVE
PH UA: 5.5
Protein, UA: NEGATIVE
RBC UA: NEGATIVE
Spec Grav, UA: 1.01
UROBILINOGEN UA: 0.2

## 2014-01-16 LAB — BASIC METABOLIC PANEL
BUN: 13 mg/dL (ref 6–23)
CALCIUM: 9.5 mg/dL (ref 8.4–10.5)
CO2: 29 meq/L (ref 19–32)
CREATININE: 1.1 mg/dL (ref 0.4–1.5)
Chloride: 101 mEq/L (ref 96–112)
GFR: 81.51 mL/min (ref >60.00)
GLUCOSE: 164 mg/dL — AB (ref 70–99)
Potassium: 4.8 mEq/L (ref 3.5–5.1)
Sodium: 138 mEq/L (ref 135–145)

## 2014-01-16 LAB — MICROALBUMIN / CREATININE URINE RATIO
CREATININE, U: 142.1 mg/dL
Microalb Creat Ratio: 0.7 mg/g (ref 0.0–30.0)
Microalb, Ur: 1 mg/dL (ref 0.0–1.9)

## 2014-01-16 LAB — CBC WITH DIFFERENTIAL/PLATELET
Basophils Absolute: 0 10*3/uL (ref 0.0–0.1)
Basophils Relative: 0.3 % (ref 0.0–3.0)
Eosinophils Absolute: 0.1 10*3/uL (ref 0.0–0.7)
Eosinophils Relative: 0.8 % (ref 0.0–5.0)
HCT: 42.6 % (ref 39.0–52.0)
Hemoglobin: 14 g/dL (ref 13.0–17.0)
Lymphocytes Relative: 36.5 % (ref 12.0–46.0)
Lymphs Abs: 2.8 10*3/uL (ref 0.7–4.0)
MCHC: 32.9 g/dL (ref 30.0–36.0)
MCV: 89.2 fl (ref 78.0–100.0)
MONO ABS: 0.7 10*3/uL (ref 0.1–1.0)
Monocytes Relative: 9.4 % (ref 3.0–12.0)
Neutro Abs: 4 10*3/uL (ref 1.4–7.7)
Neutrophils Relative %: 53 % (ref 43.0–77.0)
PLATELETS: 307 10*3/uL (ref 150.0–400.0)
RBC: 4.77 Mil/uL (ref 4.22–5.81)
RDW: 14.9 % (ref 11.5–15.5)
WBC: 7.6 10*3/uL (ref 4.0–10.5)

## 2014-01-16 LAB — HEPATIC FUNCTION PANEL
ALBUMIN: 4.3 g/dL (ref 3.5–5.2)
ALT: 26 U/L (ref 0–53)
AST: 30 U/L (ref 0–37)
Alkaline Phosphatase: 66 U/L (ref 39–117)
Bilirubin, Direct: 0.1 mg/dL (ref 0.0–0.3)
Total Bilirubin: 0.6 mg/dL (ref 0.2–1.2)
Total Protein: 7.8 g/dL (ref 6.0–8.3)

## 2014-01-16 LAB — LIPID PANEL
CHOLESTEROL: 200 mg/dL (ref 0–200)
HDL: 42 mg/dL (ref >39.00)
LDL Cholesterol: 135 mg/dL — ABNORMAL HIGH (ref 0–99)
NonHDL: 158
TRIGLYCERIDES: 117 mg/dL (ref 0.0–149.0)
Total CHOL/HDL Ratio: 5
VLDL: 23.4 mg/dL (ref 0.0–40.0)

## 2014-01-16 LAB — PSA: PSA: 2.56 ng/mL (ref 0.10–4.00)

## 2014-01-16 LAB — HEMOGLOBIN A1C: HEMOGLOBIN A1C: 7.4 % — AB (ref 4.6–6.5)

## 2014-01-16 LAB — TSH: TSH: 0.98 u[IU]/mL (ref 0.35–4.50)

## 2014-01-16 NOTE — Progress Notes (Signed)
   Subjective:    Patient ID: Jose Fleming, male    DOB: [birth year], 73 y.o.   MRN: [id number]  HPI 73 yr old male for a cpx. He feels well. His am fasting glucoses run form 110 to 160 most of the time.    Review of Systems  Constitutional: Negative.   HENT: Negative.   Eyes: Negative.   Respiratory: Negative.   Cardiovascular: Negative.   Gastrointestinal: Negative.   Genitourinary: Negative.   Musculoskeletal: Negative.   Skin: Negative.   Neurological: Negative.   Psychiatric/Behavioral: Negative.        Objective:   Physical Exam  Constitutional: He is oriented to person, place, and time. He appears well-developed and well-nourished. No distress.  HENT:  Head: Normocephalic and atraumatic.  Right Ear: External ear normal.  Left Ear: External ear normal.  Nose: Nose normal.  Mouth/Throat: Oropharynx is clear and moist. No oropharyngeal exudate.  Eyes: Conjunctivae and EOM are normal. Pupils are equal, round, and reactive to light. Right eye exhibits no discharge. Left eye exhibits no discharge. No scleral icterus.  Neck: Neck supple. No JVD present. No tracheal deviation present. No thyromegaly present.  Cardiovascular: Normal rate, regular rhythm and intact distal pulses.  Exam reveals no gallop and no friction rub.   Murmur heard. There is a new onset 2/6 SM over the left sternal margin   Pulmonary/Chest: Effort normal and breath sounds normal. No respiratory distress. He has no wheezes. He has no rales. He exhibits no tenderness.  Abdominal: Soft. Bowel sounds are normal. He exhibits no distension and no mass. There is no tenderness. There is no rebound and no guarding.  Genitourinary: Rectum normal, prostate normal and penis normal. Guaiac negative stool. No penile tenderness.  Musculoskeletal: Normal range of motion. He exhibits no edema and no tenderness.  Lymphadenopathy:    He has no cervical adenopathy.  Neurological: He is alert and oriented to person, place, and  time. He has normal reflexes. No cranial nerve deficit. He exhibits normal muscle tone. Coordination normal.  Skin: Skin is warm and dry. No rash noted. He is not diaphoretic. No erythema. No pallor.  Psychiatric: He has a normal mood and affect. His behavior is normal. Judgment and thought content normal.          Assessment & Plan:  Well exam. Get fasting labs. His systolic BP has gone up so I advised him to get back on his exercise routine like before. Set up an ECHO to evaluate the murmur.

## 2014-01-16 NOTE — Progress Notes (Signed)
Pre visit review using our clinic review tool, if applicable. No additional management support is needed unless otherwise documented below in the visit note. 

## 2014-01-17 ENCOUNTER — Ambulatory Visit (HOSPITAL_COMMUNITY)
Admission: RE | Admit: 2014-01-17 | Discharge: 2014-01-17 | Disposition: A | Discharge status: Home / Self Care | Payer: Medicare Other | Source: Ambulatory Visit | Attending: Cardiovascular Disease | Admitting: Cardiovascular Disease

## 2014-01-17 ENCOUNTER — Telehealth (HOSPITAL_COMMUNITY): Payer: Self-pay | Admitting: *Deleted

## 2014-01-17 DIAGNOSIS — E119 Type 2 diabetes mellitus without complications: Secondary | ICD-10-CM | POA: Diagnosis not present

## 2014-01-17 DIAGNOSIS — R011 Cardiac murmur, unspecified: Secondary | ICD-10-CM | POA: Insufficient documentation

## 2014-01-17 DIAGNOSIS — I10 Essential (primary) hypertension: Secondary | ICD-10-CM | POA: Insufficient documentation

## 2014-01-17 NOTE — Progress Notes (Addendum)
2D Echocardiogram Complete.  01/17/2014   Deliah Boston, RDCS  Preliminary Technician Findings:  The Aortic Velocities are mildly increased, due to thickening of the Aortic Valve.  Jose Fleming is not symptomatic at this time, and appears stable upon leaving.

## 2014-01-18 ENCOUNTER — Inpatient Hospital Stay (HOSPITAL_COMMUNITY): Admission: RE | Admit: 2014-01-18 | Payer: Medicare Other | Source: Ambulatory Visit

## 2014-01-30 ENCOUNTER — Telehealth: Payer: Self-pay

## 2014-01-30 NOTE — Telephone Encounter (Signed)
Left a message for call back.  Called patient regarding diabetic eye exam.  When patient calls back please ask:  Have you had a recent (2014-2015) eye exam?    Date of Exam?  Where?    

## 2014-02-06 ENCOUNTER — Telehealth: Payer: Self-pay | Admitting: Family Medicine

## 2014-02-06 MED ORDER — METFORMIN HCL 500 MG PO TABS: 500.0000 mg | ORAL_TABLET | Freq: Two times a day (BID) | ORAL | Status: DC

## 2014-02-06 NOTE — Telephone Encounter (Signed)
I sent script e-scribe. 

## 2014-02-06 NOTE — Telephone Encounter (Signed)
[id number] - [place], [place] [address] is requesting 90 day re-fill on metFORMIN (GLUCOPHAGE) 500 MG tablet

## 2014-03-25 NOTE — Telephone Encounter (Signed)
No call back from patient.  Encounter closed.   

## 2014-11-26 ENCOUNTER — Ambulatory Visit (INDEPENDENT_AMBULATORY_CARE_PROVIDER_SITE_OTHER): Payer: Medicare Other | Admitting: Family Medicine

## 2014-11-26 ENCOUNTER — Encounter: Payer: Self-pay | Admitting: Family Medicine

## 2014-11-26 VITALS — BP 168/82 | HR 104 | Temp 98.5°F | Ht 70.0 in | Wt 170.0 lb

## 2014-11-26 DIAGNOSIS — L259 Unspecified contact dermatitis, unspecified cause: Secondary | ICD-10-CM | POA: Diagnosis not present

## 2014-11-26 MED ORDER — METHYLPREDNISOLONE ACETATE 80 MG/ML IJ SUSP
120.0000 mg | Freq: Once | INTRAMUSCULAR | Status: AC
  Administered 2014-11-26: 120 mg via INTRAMUSCULAR

## 2014-11-26 MED ORDER — PREDNISONE 10 MG PO TABS: ORAL_TABLET | ORAL | Status: DC

## 2014-11-26 NOTE — Addendum Note (Signed)
Addended by: Aggie Hacker A on: 11/26/2014 05:08 PM   Modules accepted: Orders

## 2014-11-26 NOTE — Progress Notes (Signed)
   Subjective:    Patient ID: Jose Fleming, male    DOB: Jan 29, 1941, 74 y.o.   MRN: [id number]  HPI Here for one week of a widespread itchy rash. Otherwise he feels fine. Applying Calamine lotion.    Review of Systems  Constitutional: Negative.   Respiratory: Negative.   Cardiovascular: Negative.   Skin: Positive for rash.       Objective:   Physical Exam  Constitutional: He appears well-developed and well-nourished.  Cardiovascular: Normal rate, regular rhythm, normal heart sounds and intact distal pulses.   Pulmonary/Chest: Effort normal and breath sounds normal.  Skin:  Widespread red papules and vesicles on the arms, legs, and trunk          Assessment & Plan:  Contact dermatitis, given a steroid shot to be followed by an oral steroid taper

## 2014-11-28 ENCOUNTER — Telehealth: Payer: Self-pay | Admitting: Family Medicine

## 2014-11-28 NOTE — Telephone Encounter (Signed)
PLEASE NOTE: All timestamps contained within this report are represented as [place] Standard Time. CONFIDENTIALTY NOTICE: This fax transmission is intended only for the addressee. It contains information that is legally privileged, confidential or otherwise protected from use or disclosure. If you are not the intended recipient, you are strictly prohibited from reviewing, disclosing, copying using or disseminating any of this information or taking any action in reliance on or regarding this information. If you have received this fax in error, please notify us immediately by telephone so that we can arrange for its return to [place]. Phone: [phone], Toll-Free: [phone], Fax: [phone] Page: 1 of 1 Call Id: [id number] [facility] - Client [place] Patient Name: Jose Fleming DOB: 1940/04/29 Initial Comment Caller states he was seen recently for poison oak, is diabetic. Has not rested well. Woke up this morning with BS 226 before eating, napped now 162. Nurse Assessment Nurse: Ronnald Ramp, RN, Miranda Date/Time (Eastern Time): 11/28/2014 11:34:00 AM Confirm and document reason for call. If symptomatic, describe symptoms. ---Caller states he was seen by MD 2 days ago and was prescribed steroids. His BS this morning when he woke up was 226, He rechecked it after taking a nap it was 162. Has the patient traveled out of the country within the last 30 days? ---Not Applicable Does the patient require triage? ---Yes Related visit to physician within the last 2 weeks? ---Yes Does the PT have any chronic conditions? (i.e. diabetes, asthma, etc.) ---Yes List chronic conditions. ---Diabetes Guidelines Guideline Title Affirmed Question Affirmed Notes Diabetes - High Blood Sugar Blood glucose 60-240 mg/dl (3.5 -13 mmol/ l) (all triage questions negative) Final Disposition User [place], RN, Miranda Disagree/Comply: Leta Baptist

## 2014-11-29 NOTE — Telephone Encounter (Signed)
Please advise if MD Sarajane Jews would like to see him or advise home advice

## 2014-12-02 NOTE — Telephone Encounter (Signed)
Home advice given. Patient aware if symptomns worsen to go to ED

## 2015-01-24 ENCOUNTER — Telehealth: Payer: Self-pay | Admitting: Family Medicine

## 2015-01-24 NOTE — Telephone Encounter (Signed)
Patient Name: Jose Fleming  DOB: [birth year]    Initial Comment Caller states took too much diabetic meds   Nurse Assessment  Nurse: Raphael Gibney, RN, Vanita Ingles Date/Time (Eastern Time): 01/24/2015 9:24:47 AM  Confirm and document reason for call. If symptomatic, describe symptoms. ---Caller states he takes metformin 1 pill qd. About 15 min, he took another one. Metformin is 500 mg. He ate his breakfast. States he does not plan to take another metformin today.  Has the patient traveled out of the country within the last 30 days? ---No  Does the patient require triage? ---Yes  Related visit to physician within the last 2 weeks? ---No  Does the PT have any chronic conditions? (i.e. diabetes, asthma, etc.) ---Yes  List chronic conditions. ---diabetes     Guidelines    Guideline Title Affirmed Question Affirmed Notes  Poisoning Diabetes drug error or overdose (e.g., insulin or extra dose)    Final Disposition User   Call PCP Now Raphael Gibney, RN, Vanita Ingles    Comments  Called back line at office and spoke to [place] and advised her that pt has taken metformin 500 mg and about 15 min later took another metformin 500 mg. He has eaten breakfast and feels fine. No dizziness or lightheadedness. Does not want to come in as he feels fine and has appt for next week. States he does not plan to take another metformin today unless the doctor tells him to.   Referrals  GO TO FACILITY REFUSED   Disagree/Comply: Disagree  Disagree/Comply Reason: Disagree with instructions

## 2015-01-24 NOTE — Telephone Encounter (Signed)
Wanted to bring to Dr. Barbie Banner attention. Patient has appointment to see Dr. Sarajane Jews this upcoming Thursday,  October 6th.

## 2015-01-24 NOTE — Telephone Encounter (Signed)
Noted. He should be fine today. We will see him next week

## 2015-01-30 ENCOUNTER — Encounter: Payer: Self-pay | Admitting: Family Medicine

## 2015-01-30 ENCOUNTER — Ambulatory Visit (INDEPENDENT_AMBULATORY_CARE_PROVIDER_SITE_OTHER): Payer: Medicare Other | Admitting: Family Medicine

## 2015-01-30 VITALS — BP 166/87 | HR 88 | Temp 98.4°F | Ht 70.0 in | Wt 170.0 lb

## 2015-01-30 DIAGNOSIS — E119 Type 2 diabetes mellitus without complications: Secondary | ICD-10-CM

## 2015-01-30 DIAGNOSIS — Z23 Encounter for immunization: Secondary | ICD-10-CM | POA: Diagnosis not present

## 2015-01-30 DIAGNOSIS — I10 Essential (primary) hypertension: Secondary | ICD-10-CM

## 2015-01-30 DIAGNOSIS — Z0000 Encounter for general adult medical examination without abnormal findings: Secondary | ICD-10-CM | POA: Diagnosis not present

## 2015-01-30 LAB — POCT URINALYSIS DIPSTICK
BILIRUBIN UA: NEGATIVE
Blood, UA: NEGATIVE
Glucose, UA: NEGATIVE
KETONES UA: NEGATIVE
LEUKOCYTES UA: NEGATIVE
Nitrite, UA: NEGATIVE
PH UA: 5
Protein, UA: NEGATIVE
Spec Grav, UA: 1.02
Urobilinogen, UA: 0.2

## 2015-01-30 LAB — BASIC METABOLIC PANEL
BUN: 10 mg/dL (ref 6–23)
CHLORIDE: 102 meq/L (ref 96–112)
CO2: 32 meq/L (ref 19–32)
CREATININE: 1.05 mg/dL (ref 0.40–1.50)
Calcium: 9.6 mg/dL (ref 8.4–10.5)
GFR: 88.46 mL/min (ref >60.00)
GLUCOSE: 143 mg/dL — AB (ref 70–99)
POTASSIUM: 5.1 meq/L (ref 3.5–5.1)
Sodium: 142 mEq/L (ref 135–145)

## 2015-01-30 LAB — CBC WITH DIFFERENTIAL/PLATELET
BASOS PCT: 0.4 % (ref 0.0–3.0)
Basophils Absolute: 0 10*3/uL (ref 0.0–0.1)
EOS PCT: 0.7 % (ref 0.0–5.0)
Eosinophils Absolute: 0 10*3/uL (ref 0.0–0.7)
HEMATOCRIT: 42.2 % (ref 39.0–52.0)
Hemoglobin: 13.9 g/dL (ref 13.0–17.0)
Lymphocytes Relative: 33.6 % (ref 12.0–46.0)
Lymphs Abs: 2.3 10*3/uL (ref 0.7–4.0)
MCHC: 33 g/dL (ref 30.0–36.0)
MCV: 89.2 fl (ref 78.0–100.0)
MONOS PCT: 10.2 % (ref 3.0–12.0)
Monocytes Absolute: 0.7 10*3/uL (ref 0.1–1.0)
Neutro Abs: 3.7 10*3/uL (ref 1.4–7.7)
Neutrophils Relative %: 55.1 % (ref 43.0–77.0)
Platelets: 301 10*3/uL (ref 150.0–400.0)
RBC: 4.73 Mil/uL (ref 4.22–5.81)
RDW: 16 % — AB (ref 11.5–15.5)
WBC: 6.7 10*3/uL (ref 4.0–10.5)

## 2015-01-30 LAB — HEPATIC FUNCTION PANEL
ALBUMIN: 4.4 g/dL (ref 3.5–5.2)
ALT: 19 U/L (ref 0–53)
AST: 22 U/L (ref 0–37)
Alkaline Phosphatase: 63 U/L (ref 39–117)
Bilirubin, Direct: 0.1 mg/dL (ref 0.0–0.3)
Total Bilirubin: 0.5 mg/dL (ref 0.2–1.2)
Total Protein: 7.2 g/dL (ref 6.0–8.3)

## 2015-01-30 LAB — TSH: TSH: 1.1 u[IU]/mL (ref 0.35–4.50)

## 2015-01-30 LAB — MICROALBUMIN / CREATININE URINE RATIO
Creatinine,U: 133.4 mg/dL
MICROALB/CREAT RATIO: 0.9 mg/g (ref 0.0–30.0)
Microalb, Ur: 1.2 mg/dL (ref 0.0–1.9)

## 2015-01-30 LAB — PSA: PSA: 2.85 ng/mL (ref 0.10–4.00)

## 2015-01-30 LAB — HEMOGLOBIN A1C: Hgb A1c MFr Bld: 6.8 % — ABNORMAL HIGH (ref 4.6–6.5)

## 2015-01-30 LAB — LIPID PANEL
CHOLESTEROL: 218 mg/dL — AB (ref 0–200)
HDL: 49.9 mg/dL (ref >39.00)
LDL Cholesterol: 146 mg/dL — ABNORMAL HIGH (ref 0–99)
NonHDL: 168.38
TRIGLYCERIDES: 111 mg/dL (ref 0.0–149.0)
Total CHOL/HDL Ratio: 4
VLDL: 22.2 mg/dL (ref 0.0–40.0)

## 2015-01-30 NOTE — Progress Notes (Signed)
Pre visit review using our clinic review tool, if applicable. No additional management support is needed unless otherwise documented below in the visit note. 

## 2015-01-30 NOTE — Progress Notes (Signed)
   Subjective:    Patient ID: Jose Fleming, male    DOB: Jan 19, 1941, 74 y.o.   MRN: [id number]  HPI 74 yr old male for a well exam. He feels fine and has no concerns. His BP at home is stable in the range of 120-140 over 80. His am fasting glucoses run from 120 to 130 mostly. He walks every day for exercise.    Review of Systems  Constitutional: Negative.   HENT: Negative.   Eyes: Negative.   Respiratory: Negative.   Cardiovascular: Negative.   Gastrointestinal: Negative.   Genitourinary: Negative.   Musculoskeletal: Negative.   Skin: Negative.   Neurological: Negative.   Psychiatric/Behavioral: Negative.        Objective:   Physical Exam  Constitutional: He is oriented to person, place, and time. He appears well-developed and well-nourished. No distress.  HENT:  Head: Normocephalic and atraumatic.  Right Ear: External ear normal.  Left Ear: External ear normal.  Nose: Nose normal.  Mouth/Throat: Oropharynx is clear and moist. No oropharyngeal exudate.  Eyes: Conjunctivae and EOM are normal. Pupils are equal, round, and reactive to light. Right eye exhibits no discharge. Left eye exhibits no discharge. No scleral icterus.  Neck: Neck supple. No JVD present. No tracheal deviation present. No thyromegaly present.  Cardiovascular: Normal rate, regular rhythm, normal heart sounds and intact distal pulses.  Exam reveals no gallop and no friction rub.   No murmur heard. EKG shows stable incomplete RBBB   Pulmonary/Chest: Effort normal and breath sounds normal. No respiratory distress. He has no wheezes. He has no rales. He exhibits no tenderness.  Abdominal: Soft. Bowel sounds are normal. He exhibits no distension and no mass. There is no tenderness. There is no rebound and no guarding.  Genitourinary: Rectum normal, prostate normal and penis normal. Guaiac negative stool. No penile tenderness.  Musculoskeletal: Normal range of motion. He exhibits no edema or tenderness.    Lymphadenopathy:    He has no cervical adenopathy.  Neurological: He is alert and oriented to person, place, and time. He has normal reflexes. No cranial nerve deficit. He exhibits normal muscle tone. Coordination normal.  Skin: Skin is warm and dry. No rash noted. He is not diaphoretic. No erythema. No pallor.  Psychiatric: He has a normal mood and affect. His behavior is normal. Judgment and thought content normal.          Assessment & Plan:  Well exam. We discussed diet and exercise advice. Get fasting labs.

## 2015-02-04 ENCOUNTER — Other Ambulatory Visit: Payer: Self-pay | Admitting: Family Medicine

## 2015-03-24 ENCOUNTER — Telehealth: Payer: Self-pay | Admitting: *Deleted

## 2015-03-24 NOTE — Telephone Encounter (Signed)
30 day supply of Strips successfully called in by Susanne Greenhouse, RN with [facility] on 11/25.  --------------------------------------------------------------------------------------------------------------------------------------------------------------------------------------------------------------- PLEASE NOTE: All timestamps contained within this report are represented as [place] Standard Time. CONFIDENTIALTY NOTICE: This fax transmission is intended only for the addressee. It contains information that is legally privileged, confidential or otherwise protected from use or disclosure. If you are not the intended recipient, you are strictly prohibited from reviewing, disclosing, copying using or disseminating any of this information or taking any action in reliance on or regarding this information. If you have received this fax in error, please notify us immediately by telephone so that we can arrange for its return to [place]. Phone: [phone], Toll-Free: [phone], Fax: [phone] Page: 1 of 2 Call Id: [id number] [facility] - Client [place] Patient Name: Jose Fleming Gender: Male DOB: 26-Jun-1940 Age: 74 Y 58 M 4 D Return Phone Number: [phone] (Primary), [phone] (Secondary) Address: City/State/Zip: [place] Client [facility] - Client Client Site [facility] - Night Physician [place], [place] Type Call Call Type Triage / Clinical Caller Name Zacharias Pavao Relationship To Patient Spouse Return Phone Number [phone] (Primary) Chief Complaint Prescription Refill or Medication Request (non symptomatic) Initial Comment Caller States Husband did not get test strips through the mail, he needs them today, no symptoms Nurse Assessment Nurse: Germain Osgood, RN, Opal Sidles Date/Time Eilene Ghazi Time): 03/21/2015 4:45:38 PM Confirm and document reason for call. If  symptomatic, describe symptoms. ---Caller states patient uses an accucheck Aviva plus glucose monitor machine. His mail order strips did not come in. Would Like called to [facility] on [address] [phone]. Still wants all other items through mail order. Has the patient traveled out of the country within the last 30 days? ---Not Applicable Does the patient have any new or worsening symptoms? ---No Please document clinical information provided and list any resource used. ---Caller advised nurse will check with on call MD Guidelines Guideline Title Affirmed Question Affirmed Notes Nurse Date/Time ([place] Time) Disp. Time Eilene Ghazi Time) Disposition Final User 03/21/2015 4:57:23 PM Send To RN Personal Germain Osgood, RN, Jane 03/21/2015 5:06:06 PM Paged On Call back to [facility], RN, Opal Sidles 03/21/2015 5:18:19 PM Paged On Call back to [facility], RNOpal Sidles 03/21/2015 6:27:14 PM Called On-Call Provider Germain Osgood, RN, Jane 03/21/2015 6:42:24 PM Pharmacy Call Germain Osgood, RN, Opal Sidles Reason: Rx fro Accucheck Aviva plus test strips called to [facility]. 30 day supply use as directed. 03/21/2015 6:43:16 PM Clinical Call Yes Germain Osgood RN, Opal Sidles After Care Instructions Given Call Event Type User Date / Time Description PLEASE NOTE: All timestamps contained within this report are represented as [place] Standard Time. CONFIDENTIALTY NOTICE: This fax transmission is intended only for the addressee. It contains information that is legally privileged, confidential or otherwise protected from use or disclosure. If you are not the intended recipient, you are strictly prohibited from reviewing, disclosing, copying using or disseminating any of this information or taking any action in reliance on or regarding this information. If you have received this fax in error, please notify us immediately by telephone so that we can arrange for its return to [place]. Phone: [phone], Toll-Free: [phone],  Fax: [phone] Page: 2 of 2 Call Id: [id number] Comments User: Susanne Greenhouse, RN Date/Time Eilene Ghazi Time): 03/21/2015 6:40:12 PM [address] [place] Phone DateTime Result/Outcome Message Type Notes Colin Benton [id number] 03/21/2015 5:06:06 PM  Paged On Call Back to Call Center Doctor Paged PLease call Minus Liberty @ the call center [phone] Colin Benton 03/21/2015 5:17:49 PM Unable to Reach on call - Max Attempts Message Result Called back and nurse did not receive call. Colin Benton [id number] 03/21/2015 5:18:18 PM Paged On Call Back to Call Center Doctor Paged Please cal Minus Liberty @ the call center [phone] Colin Benton 03/21/2015 6:25:13 PM Unable to Reach on call - Max Attempts Message Result Colin Benton [id number] 03/21/2015 6:27:14 PM Called On Call Provider - Reached Doctor Paged Colin Benton 03/21/2015 6:27:35 PM Spoke with On Call - General Message Result Approved order for [facility] test strips 1 Box of 100 strips.

## 2015-05-19 ENCOUNTER — Telehealth: Payer: Self-pay | Admitting: Family Medicine

## 2015-05-19 NOTE — Telephone Encounter (Addendum)
Pt needs new rxs one touch meter also lancet and test strips send to [facility] on [address]. This is pt new pharm, Pt check his bs twice a day. Pt needs 90 day supply. Pt also needs rx  bp machine

## 2015-05-20 MED ORDER — GLUCOSE BLOOD VI STRP: ORAL_STRIP | Status: DC

## 2015-05-20 MED ORDER — ONETOUCH BASIC SYSTEM W/DEVICE KIT
PACK | Status: DC
Start: 2015-05-20 — End: 2015-05-20

## 2015-05-20 MED ORDER — ONETOUCH LANCETS MISC: Status: DC

## 2015-05-20 MED ORDER — ONETOUCH BASIC SYSTEM W/DEVICE KIT: PACK | Status: DC

## 2015-05-20 NOTE — Telephone Encounter (Signed)
I sent scripts e-scribe for machine, stripts, & lancets to [facility].

## 2015-05-20 NOTE — Telephone Encounter (Signed)
rx is ready

## 2015-05-20 NOTE — Telephone Encounter (Signed)
Pt needs the script for b/p machine.

## 2015-05-20 NOTE — Telephone Encounter (Signed)
Script was faxed for cuff to [facility].

## 2015-08-16 ENCOUNTER — Other Ambulatory Visit: Payer: Self-pay | Admitting: Family Medicine

## 2015-08-18 ENCOUNTER — Telehealth: Payer: Self-pay | Admitting: Family Medicine

## 2015-08-18 NOTE — Telephone Encounter (Signed)
Script was sent e-scribe earlier this morning.

## 2015-08-18 NOTE — Telephone Encounter (Signed)
Pt need new Rx for Metformin.  Pharm:  [facility]

## 2015-11-22 ENCOUNTER — Other Ambulatory Visit: Payer: Self-pay | Admitting: Family Medicine

## 2015-11-24 ENCOUNTER — Other Ambulatory Visit: Payer: Self-pay | Admitting: Family Medicine

## 2016-02-20 ENCOUNTER — Telehealth: Payer: Self-pay | Admitting: Family Medicine

## 2016-02-20 NOTE — Telephone Encounter (Signed)
Pt had to reschedule cpx however will be out of med by then.  Pt needs a 60 day to get through until Dec 6. metFORMIN (GLUCOPHAGE) 500 MG tablet   [facility]/ [address]

## 2016-02-24 ENCOUNTER — Other Ambulatory Visit: Payer: Self-pay | Admitting: Family Medicine

## 2016-02-24 MED ORDER — METFORMIN HCL 500 MG PO TABS: ORAL_TABLET | ORAL | 1 refills | Status: DC

## 2016-02-24 NOTE — Telephone Encounter (Signed)
Ok per Dr. Sarajane Jews to refill for 30 days with 1 refill, this will last until patient has office visit on 03/27/2016. I sent script e-scribe and spoke with pt.

## 2016-02-24 NOTE — Telephone Encounter (Signed)
I sent script e-scribe to below pharmacy.  

## 2016-02-24 NOTE — Telephone Encounter (Signed)
Refill request for Metformin and send to [facility].

## 2016-02-25 ENCOUNTER — Other Ambulatory Visit: Payer: Self-pay | Admitting: Family Medicine

## 2016-03-31 ENCOUNTER — Encounter: Payer: Self-pay | Admitting: Family Medicine

## 2016-03-31 ENCOUNTER — Ambulatory Visit (INDEPENDENT_AMBULATORY_CARE_PROVIDER_SITE_OTHER): Payer: Medicare Other | Admitting: Family Medicine

## 2016-03-31 VITALS — BP 160/88 | HR 93 | Temp 97.9°F | Ht 70.0 in | Wt 166.0 lb

## 2016-03-31 DIAGNOSIS — Z23 Encounter for immunization: Secondary | ICD-10-CM

## 2016-03-31 DIAGNOSIS — Z0000 Encounter for general adult medical examination without abnormal findings: Secondary | ICD-10-CM | POA: Diagnosis not present

## 2016-03-31 DIAGNOSIS — E119 Type 2 diabetes mellitus without complications: Secondary | ICD-10-CM | POA: Diagnosis not present

## 2016-03-31 LAB — POC URINALSYSI DIPSTICK (AUTOMATED)
Bilirubin, UA: NEGATIVE
Blood, UA: NEGATIVE
GLUCOSE UA: NEGATIVE
KETONES UA: NEGATIVE
Leukocytes, UA: NEGATIVE
Nitrite, UA: NEGATIVE
Protein, UA: NEGATIVE
SPEC GRAV UA: 1.02
Urobilinogen, UA: 0.2
pH, UA: 5

## 2016-03-31 LAB — HEPATIC FUNCTION PANEL
ALK PHOS: 62 U/L (ref 39–117)
ALT: 14 U/L (ref 0–53)
AST: 20 U/L (ref 0–37)
Albumin: 4.6 g/dL (ref 3.5–5.2)
BILIRUBIN DIRECT: 0.1 mg/dL (ref 0.0–0.3)
TOTAL PROTEIN: 7.2 g/dL (ref 6.0–8.3)
Total Bilirubin: 0.5 mg/dL (ref 0.2–1.2)

## 2016-03-31 LAB — CBC WITH DIFFERENTIAL/PLATELET
BASOS PCT: 0.4 % (ref 0.0–3.0)
Basophils Absolute: 0 10*3/uL (ref 0.0–0.1)
EOS ABS: 0 10*3/uL (ref 0.0–0.7)
EOS PCT: 0.3 % (ref 0.0–5.0)
HCT: 42.3 % (ref 39.0–52.0)
Hemoglobin: 14.2 g/dL (ref 13.0–17.0)
LYMPHS ABS: 2.2 10*3/uL (ref 0.7–4.0)
Lymphocytes Relative: 32 % (ref 12.0–46.0)
MCHC: 33.5 g/dL (ref 30.0–36.0)
MCV: 87.9 fl (ref 78.0–100.0)
MONO ABS: 0.6 10*3/uL (ref 0.1–1.0)
Monocytes Relative: 9 % (ref 3.0–12.0)
NEUTROS ABS: 4 10*3/uL (ref 1.4–7.7)
NEUTROS PCT: 58.3 % (ref 43.0–77.0)
PLATELETS: 323 10*3/uL (ref 150.0–400.0)
RBC: 4.81 Mil/uL (ref 4.22–5.81)
RDW: 15.4 % (ref 11.5–15.5)
WBC: 6.8 10*3/uL (ref 4.0–10.5)

## 2016-03-31 LAB — LIPID PANEL
CHOL/HDL RATIO: 4
CHOLESTEROL: 221 mg/dL — AB (ref 0–200)
HDL: 53.3 mg/dL (ref >39.00)
LDL CALC: 150 mg/dL — AB (ref 0–99)
NONHDL: 167.78
Triglycerides: 89 mg/dL (ref 0.0–149.0)
VLDL: 17.8 mg/dL (ref 0.0–40.0)

## 2016-03-31 LAB — BASIC METABOLIC PANEL
BUN: 11 mg/dL (ref 6–23)
CO2: 31 mEq/L (ref 19–32)
Calcium: 9.8 mg/dL (ref 8.4–10.5)
Chloride: 102 mEq/L (ref 96–112)
Creatinine, Ser: 1.1 mg/dL (ref 0.40–1.50)
GFR: 83.58 mL/min (ref >60.00)
Glucose, Bld: 123 mg/dL — ABNORMAL HIGH (ref 70–99)
POTASSIUM: 4.7 meq/L (ref 3.5–5.1)
SODIUM: 140 meq/L (ref 135–145)

## 2016-03-31 LAB — HEMOGLOBIN A1C: HEMOGLOBIN A1C: 5.9 % (ref 4.6–6.5)

## 2016-03-31 LAB — TSH: TSH: 1.43 u[IU]/mL (ref 0.35–4.50)

## 2016-03-31 LAB — PSA: PSA: 3.06 ng/mL (ref 0.10–4.00)

## 2016-03-31 MED ORDER — METFORMIN HCL 500 MG PO TABS: ORAL_TABLET | ORAL | 3 refills | Status: DC

## 2016-03-31 NOTE — Addendum Note (Signed)
Addended by: Aggie Hacker A on: 03/31/2016 01:47 PM   Modules accepted: Orders

## 2016-03-31 NOTE — Progress Notes (Signed)
Pre visit review using our clinic review tool, if applicable. No additional management support is needed unless otherwise documented below in the visit note. 

## 2016-03-31 NOTE — Progress Notes (Signed)
   Subjective:    Patient ID: AMERE WESTROPE, male    DOB: 01-Apr-1941, 75 y.o.   MRN: [id number]  HPI 75 yr old male for a well exam. He feels fine. His am fasting glucoses average 100 to 120.    Review of Systems  Constitutional: Negative.   HENT: Negative.   Eyes: Negative.   Respiratory: Negative.   Cardiovascular: Negative.   Gastrointestinal: Negative.   Genitourinary: Negative.   Musculoskeletal: Negative.   Skin: Negative.   Neurological: Negative.   Psychiatric/Behavioral: Negative.        Objective:   Physical Exam  Constitutional: He is oriented to person, place, and time. He appears well-developed and well-nourished. No distress.  HENT:  Head: Normocephalic and atraumatic.  Right Ear: External ear normal.  Left Ear: External ear normal.  Nose: Nose normal.  Mouth/Throat: Oropharynx is clear and moist. No oropharyngeal exudate.  Eyes: Conjunctivae and EOM are normal. Pupils are equal, round, and reactive to light. Right eye exhibits no discharge. Left eye exhibits no discharge. No scleral icterus.  Neck: Neck supple. No JVD present. No tracheal deviation present. No thyromegaly present.  Cardiovascular: Normal rate, regular rhythm, normal heart sounds and intact distal pulses.  Exam reveals no gallop and no friction rub.   No murmur heard. Pulmonary/Chest: Effort normal and breath sounds normal. No respiratory distress. He has no wheezes. He has no rales. He exhibits no tenderness.  Abdominal: Soft. Bowel sounds are normal. He exhibits no distension and no mass. There is no tenderness. There is no rebound and no guarding.  Genitourinary: Rectum normal, prostate normal and penis normal. Rectal exam shows guaiac negative stool. No penile tenderness.  Musculoskeletal: Normal range of motion. He exhibits no edema or tenderness.  Lymphadenopathy:    He has no cervical adenopathy.  Neurological: He is alert and oriented to person, place, and time. He has normal reflexes.  No cranial nerve deficit. He exhibits normal muscle tone. Coordination normal.  Skin: Skin is warm and dry. No rash noted. He is not diaphoretic. No erythema. No pallor.  Psychiatric: He has a normal mood and affect. His behavior is normal. Judgment and thought content normal.          Assessment & Plan:  Well exam. We discussed diet and exercise. Get fasting labs today.  Laurey Morale, MD

## 2016-06-14 ENCOUNTER — Other Ambulatory Visit: Payer: Self-pay | Admitting: Family Medicine

## 2016-06-14 NOTE — Telephone Encounter (Signed)
Pt request refill  ONE TOUCH ULTRA TEST test strip [facility] LANCETS [id number] [place], [place] - [address]

## 2016-06-15 MED ORDER — GLUCOSE BLOOD VI STRP: ORAL_STRIP | 2 refills | Status: DC

## 2016-06-15 MED ORDER — ONETOUCH DELICA LANCETS 33G MISC: 2 refills | Status: DC

## 2016-06-15 NOTE — Telephone Encounter (Signed)
Rxs sent

## 2017-04-01 ENCOUNTER — Encounter: Payer: Self-pay | Admitting: Family Medicine

## 2017-04-01 ENCOUNTER — Ambulatory Visit (INDEPENDENT_AMBULATORY_CARE_PROVIDER_SITE_OTHER): Payer: Medicare Other | Admitting: Family Medicine

## 2017-04-01 VITALS — BP 160/80 | HR 99 | Temp 97.8°F | Wt 163.6 lb

## 2017-04-01 DIAGNOSIS — N401 Enlarged prostate with lower urinary tract symptoms: Secondary | ICD-10-CM

## 2017-04-01 DIAGNOSIS — N138 Other obstructive and reflux uropathy: Secondary | ICD-10-CM

## 2017-04-01 DIAGNOSIS — Z23 Encounter for immunization: Secondary | ICD-10-CM | POA: Diagnosis not present

## 2017-04-01 DIAGNOSIS — E119 Type 2 diabetes mellitus without complications: Secondary | ICD-10-CM

## 2017-04-01 DIAGNOSIS — I10 Essential (primary) hypertension: Secondary | ICD-10-CM

## 2017-04-01 LAB — BASIC METABOLIC PANEL
BUN: 12 mg/dL (ref 6–23)
CHLORIDE: 101 meq/L (ref 96–112)
CO2: 28 meq/L (ref 19–32)
CREATININE: 1.09 mg/dL (ref 0.40–1.50)
Calcium: 9.4 mg/dL (ref 8.4–10.5)
GFR: 84.24 mL/min (ref >60.00)
GLUCOSE: 115 mg/dL — AB (ref 70–99)
Potassium: 4.2 mEq/L (ref 3.5–5.1)
Sodium: 138 mEq/L (ref 135–145)

## 2017-04-01 LAB — HEPATIC FUNCTION PANEL
ALT: 15 U/L (ref 0–53)
AST: 19 U/L (ref 0–37)
Albumin: 4.4 g/dL (ref 3.5–5.2)
Alkaline Phosphatase: 62 U/L (ref 39–117)
BILIRUBIN TOTAL: 0.6 mg/dL (ref 0.2–1.2)
Bilirubin, Direct: 0.1 mg/dL (ref 0.0–0.3)
Total Protein: 6.8 g/dL (ref 6.0–8.3)

## 2017-04-01 LAB — CBC WITH DIFFERENTIAL/PLATELET
Basophils Absolute: 0 10*3/uL (ref 0.0–0.1)
Basophils Relative: 0.3 % (ref 0.0–3.0)
EOS PCT: 0.8 % (ref 0.0–5.0)
Eosinophils Absolute: 0.1 10*3/uL (ref 0.0–0.7)
HCT: 42.4 % (ref 39.0–52.0)
Hemoglobin: 14.1 g/dL (ref 13.0–17.0)
LYMPHS ABS: 2.3 10*3/uL (ref 0.7–4.0)
Lymphocytes Relative: 34.1 % (ref 12.0–46.0)
MCHC: 33.3 g/dL (ref 30.0–36.0)
MCV: 89.9 fl (ref 78.0–100.0)
MONOS PCT: 11.5 % (ref 3.0–12.0)
Monocytes Absolute: 0.8 10*3/uL (ref 0.1–1.0)
NEUTROS ABS: 3.6 10*3/uL (ref 1.4–7.7)
NEUTROS PCT: 53.3 % (ref 43.0–77.0)
PLATELETS: 317 10*3/uL (ref 150.0–400.0)
RBC: 4.72 Mil/uL (ref 4.22–5.81)
RDW: 14.9 % (ref 11.5–15.5)
WBC: 6.7 10*3/uL (ref 4.0–10.5)

## 2017-04-01 LAB — POC URINALSYSI DIPSTICK (AUTOMATED)
BILIRUBIN UA: NEGATIVE
Blood, UA: NEGATIVE
Glucose, UA: NEGATIVE
KETONES UA: NEGATIVE
LEUKOCYTES UA: NEGATIVE
Nitrite, UA: NEGATIVE
PH UA: 6 (ref 5.0–8.0)
Protein, UA: NEGATIVE
Spec Grav, UA: >=1.03 — AB (ref 1.010–1.025)
Urobilinogen, UA: 0.2 E.U./dL

## 2017-04-01 LAB — LIPID PANEL
CHOLESTEROL: 185 mg/dL (ref 0–200)
HDL: 45.7 mg/dL (ref >39.00)
LDL Cholesterol: 121 mg/dL — ABNORMAL HIGH (ref 0–99)
NONHDL: 139.46
TRIGLYCERIDES: 90 mg/dL (ref 0.0–149.0)
Total CHOL/HDL Ratio: 4
VLDL: 18 mg/dL (ref 0.0–40.0)

## 2017-04-01 LAB — TSH: TSH: 1.38 u[IU]/mL (ref 0.35–4.50)

## 2017-04-01 LAB — HEMOGLOBIN A1C: Hgb A1c MFr Bld: 6.1 % (ref 4.6–6.5)

## 2017-04-01 LAB — PSA: PSA: 3.67 ng/mL (ref 0.10–4.00)

## 2017-04-01 MED ORDER — GLUCOSE BLOOD VI STRP: ORAL_STRIP | 3 refills | Status: DC

## 2017-04-01 MED ORDER — ONETOUCH DELICA LANCETS 33G MISC: 1.0000 "application " | Freq: Every day | 3 refills | Status: DC

## 2017-04-01 MED ORDER — METFORMIN HCL 500 MG PO TABS: ORAL_TABLET | ORAL | 3 refills | Status: DC

## 2017-04-01 NOTE — Progress Notes (Signed)
   Subjective:    Patient ID: Jose Fleming, male    DOB: [birth year], 76 y.o.   MRN: [id number]  HPI Here to follow up on issues. He feels great. He still goes to the gym to work out. His BP is stable at home. He has not checked his glucoses for awhile.    Review of Systems  Constitutional: Negative.   HENT: Negative.   Eyes: Negative.   Respiratory: Negative.   Cardiovascular: Negative.   Gastrointestinal: Negative.   Genitourinary: Negative.   Musculoskeletal: Negative.   Skin: Negative.   Neurological: Negative.   Psychiatric/Behavioral: Negative.        Objective:   Physical Exam  Constitutional: He is oriented to person, place, and time. He appears well-developed and well-nourished. No distress.  HENT:  Head: Normocephalic and atraumatic.  Right Ear: External ear normal.  Left Ear: External ear normal.  Nose: Nose normal.  Mouth/Throat: Oropharynx is clear and moist. No oropharyngeal exudate.  Eyes: Conjunctivae and EOM are normal. Pupils are equal, round, and reactive to light. Right eye exhibits no discharge. Left eye exhibits no discharge. No scleral icterus.  Neck: Neck supple. No JVD present. No tracheal deviation present. No thyromegaly present.  Cardiovascular: Normal rate, regular rhythm and intact distal pulses. Exam reveals no gallop and no friction rub.  Has a 2/6 holosystolic murmur   Pulmonary/Chest: Effort normal and breath sounds normal. No respiratory distress. He has no wheezes. He has no rales. He exhibits no tenderness.  Abdominal: Soft. Bowel sounds are normal. He exhibits no distension and no mass. There is no tenderness. There is no rebound and no guarding.  Genitourinary: Rectum normal, prostate normal and penis normal. Rectal exam shows guaiac negative stool. No penile tenderness.  Musculoskeletal: Normal range of motion. He exhibits no edema or tenderness.  Lymphadenopathy:    He has no cervical adenopathy.  Neurological: He is alert and oriented  to person, place, and time. He has normal reflexes. No cranial nerve deficit. He exhibits normal muscle tone. Coordination normal.  Skin: Skin is warm and dry. No rash noted. He is not diaphoretic. No erythema. No pallor.  Psychiatric: He has a normal mood and affect. His behavior is normal. Judgment and thought content normal.          Assessment & Plan:  His BP is stable. We discussed diet and exercise. Get fasting labs to check lipids, etc. Get an A1c to follow the diabetes.  Alysia Penna, MD

## 2017-12-30 ENCOUNTER — Telehealth: Payer: Self-pay | Admitting: Family Medicine

## 2017-12-30 MED ORDER — GLUCOSE BLOOD VI STRP: ORAL_STRIP | 3 refills | Status: DC

## 2017-12-30 MED ORDER — ONETOUCH DELICA LANCETS 33G MISC: 1.0000 "application " | Freq: Every day | 3 refills | Status: DC

## 2017-12-30 NOTE — Telephone Encounter (Signed)
Copied from [place] [id number] [phone]. Topic: Quick Communication - Rx Refill/Question >> Dec 30, 2017  3:56 PM Synthia Innocent wrote: Medication:ONETOUCH DELICA LANCETS 64V MISC and glucose blood (ONE TOUCH ULTRA TEST) test strip  Has the patient contacted their pharmacy? Yes.  New Pharmacy (Agent: If no, request that the patient contact the pharmacy for the refill.) (Agent: If yes, when and what did the pharmacy advise?)  Preferred Pharmacy (with phone number or street name): [facility] on [address]: Please be advised that RX refills may take up to 3 business days. We ask that you follow-up with your pharmacy.

## 2018-04-07 ENCOUNTER — Encounter: Payer: Medicare Other | Admitting: Family Medicine

## 2018-04-17 ENCOUNTER — Other Ambulatory Visit: Payer: Self-pay | Admitting: Family Medicine

## 2018-04-17 MED ORDER — METFORMIN HCL 500 MG PO TABS: ORAL_TABLET | ORAL | 0 refills | Status: DC

## 2018-04-17 NOTE — Telephone Encounter (Signed)
Pt is scheduled for CPX on 05/02/18.  90 day supply sent to the pharmacy.

## 2018-04-17 NOTE — Telephone Encounter (Signed)
Copied from [place] [id number]. Topic: Quick Communication - Rx Refill/Question >> Apr 17, 2018  9:14 AM Blase Mess A wrote: Medication: metFORMIN (GLUCOPHAGE) 500 MG tablet [[id number]]   Has the patient contacted their pharmacy? Yes  (Agent: If no, request that the patient contact the pharmacy for the refill.) (Agent: If yes, when and what did the pharmacy advise?)  Preferred Pharmacy (with phone number or street name):   Agent: Please be advised that RX refills may take up to 3 business days. We ask that you follow-up with your pharmacy.

## 2018-05-11 ENCOUNTER — Telehealth: Payer: Self-pay

## 2018-05-11 NOTE — Telephone Encounter (Signed)
Error

## 2018-05-12 ENCOUNTER — Ambulatory Visit (INDEPENDENT_AMBULATORY_CARE_PROVIDER_SITE_OTHER): Payer: Medicare Other | Admitting: Family Medicine

## 2018-05-12 ENCOUNTER — Encounter: Payer: Self-pay | Admitting: Family Medicine

## 2018-05-12 VITALS — BP 142/84 | HR 89 | Temp 98.4°F | Ht 68.75 in | Wt 157.0 lb

## 2018-05-12 DIAGNOSIS — G629 Polyneuropathy, unspecified: Secondary | ICD-10-CM | POA: Diagnosis not present

## 2018-05-12 DIAGNOSIS — I10 Essential (primary) hypertension: Secondary | ICD-10-CM | POA: Diagnosis not present

## 2018-05-12 DIAGNOSIS — N138 Other obstructive and reflux uropathy: Secondary | ICD-10-CM

## 2018-05-12 DIAGNOSIS — N401 Enlarged prostate with lower urinary tract symptoms: Secondary | ICD-10-CM | POA: Diagnosis not present

## 2018-05-12 DIAGNOSIS — Z23 Encounter for immunization: Secondary | ICD-10-CM | POA: Diagnosis not present

## 2018-05-12 DIAGNOSIS — E1140 Type 2 diabetes mellitus with diabetic neuropathy, unspecified: Secondary | ICD-10-CM | POA: Diagnosis not present

## 2018-05-12 DIAGNOSIS — E785 Hyperlipidemia, unspecified: Secondary | ICD-10-CM | POA: Diagnosis not present

## 2018-05-12 LAB — CBC WITH DIFFERENTIAL/PLATELET
BASOS ABS: 0 10*3/uL (ref 0.0–0.1)
Basophils Relative: 0.4 % (ref 0.0–3.0)
EOS ABS: 0 10*3/uL (ref 0.0–0.7)
Eosinophils Relative: 0.3 % (ref 0.0–5.0)
HEMATOCRIT: 42.5 % (ref 39.0–52.0)
Hemoglobin: 14.4 g/dL (ref 13.0–17.0)
LYMPHS PCT: 31.2 % (ref 12.0–46.0)
Lymphs Abs: 1.9 10*3/uL (ref 0.7–4.0)
MCHC: 34 g/dL (ref 30.0–36.0)
MCV: 88 fl (ref 78.0–100.0)
MONOS PCT: 9.1 % (ref 3.0–12.0)
Monocytes Absolute: 0.6 10*3/uL (ref 0.1–1.0)
NEUTROS PCT: 59 % (ref 43.0–77.0)
Neutro Abs: 3.6 10*3/uL (ref 1.4–7.7)
Platelets: 368 10*3/uL (ref 150.0–400.0)
RBC: 4.83 Mil/uL (ref 4.22–5.81)
RDW: 14.8 % (ref 11.5–15.5)
WBC: 6.2 10*3/uL (ref 4.0–10.5)

## 2018-05-12 LAB — POC URINALSYSI DIPSTICK (AUTOMATED)
Bilirubin, UA: NEGATIVE
Glucose, UA: NEGATIVE
Ketones, UA: NEGATIVE
LEUKOCYTES UA: NEGATIVE
NITRITE UA: NEGATIVE
PH UA: 6 (ref 5.0–8.0)
PROTEIN UA: NEGATIVE
RBC UA: NEGATIVE
Spec Grav, UA: 1.02 (ref 1.010–1.025)
Urobilinogen, UA: 0.2 E.U./dL

## 2018-05-12 LAB — LIPID PANEL
CHOL/HDL RATIO: 4
Cholesterol: 200 mg/dL (ref 0–200)
HDL: 48.7 mg/dL (ref >39.00)
LDL CALC: 137 mg/dL — AB (ref 0–99)
NonHDL: 151.09
TRIGLYCERIDES: 71 mg/dL (ref 0.0–149.0)
VLDL: 14.2 mg/dL (ref 0.0–40.0)

## 2018-05-12 LAB — BASIC METABOLIC PANEL
BUN: 12 mg/dL (ref 6–23)
CALCIUM: 10 mg/dL (ref 8.4–10.5)
CO2: 30 mEq/L (ref 19–32)
CREATININE: 1.07 mg/dL (ref 0.40–1.50)
Chloride: 101 mEq/L (ref 96–112)
GFR: 80.74 mL/min (ref >60.00)
Glucose, Bld: 121 mg/dL — ABNORMAL HIGH (ref 70–99)
Potassium: 4.9 mEq/L (ref 3.5–5.1)
Sodium: 140 mEq/L (ref 135–145)

## 2018-05-12 LAB — HEPATIC FUNCTION PANEL
ALBUMIN: 4.6 g/dL (ref 3.5–5.2)
ALT: 11 U/L (ref 0–53)
AST: 19 U/L (ref 0–37)
Alkaline Phosphatase: 63 U/L (ref 39–117)
Bilirubin, Direct: 0.1 mg/dL (ref 0.0–0.3)
Total Bilirubin: 0.5 mg/dL (ref 0.2–1.2)
Total Protein: 7.4 g/dL (ref 6.0–8.3)

## 2018-05-12 LAB — HEMOGLOBIN A1C: Hgb A1c MFr Bld: 5.9 % (ref 4.6–6.5)

## 2018-05-12 LAB — TSH: TSH: 1.16 u[IU]/mL (ref 0.35–4.50)

## 2018-05-12 LAB — PSA: PSA: 2.98 ng/mL (ref 0.10–4.00)

## 2018-05-12 MED ORDER — METFORMIN HCL 500 MG PO TABS: ORAL_TABLET | ORAL | 3 refills | Status: DC

## 2018-05-12 MED ORDER — ONETOUCH DELICA LANCETS 33G MISC: 1.0000 "application " | Freq: Every day | 3 refills | Status: DC

## 2018-05-12 MED ORDER — GLUCOSE BLOOD VI STRP: ORAL_STRIP | 3 refills | Status: DC

## 2018-05-12 NOTE — Progress Notes (Signed)
Subjective:    Patient ID: Jose Fleming, male    DOB: 1940-07-30, 78 y.o.   MRN: [id number]  HPI Here to follow up on issues. He feels well except for some pain in the feet and hands. He also has some numbness and tingling in the feet. His am fasting glucoses are always on the range of 100s to 110s. His BP is stable.    Review of Systems  Constitutional: Negative.   HENT: Negative.   Eyes: Negative.   Respiratory: Negative.   Cardiovascular: Negative.   Gastrointestinal: Negative.   Genitourinary: Negative.   Musculoskeletal: Positive for arthralgias.  Skin: Negative.   Neurological: Positive for numbness.  Psychiatric/Behavioral: Negative.        Objective:   Physical Exam Constitutional:      General: He is not in acute distress.    Appearance: He is well-developed. He is not diaphoretic.  HENT:     Head: Normocephalic and atraumatic.     Right Ear: External ear normal.     Left Ear: External ear normal.     Nose: Nose normal.     Mouth/Throat:     Pharynx: No oropharyngeal exudate.  Eyes:     General: No scleral icterus.       Right eye: No discharge.        Left eye: No discharge.     Conjunctiva/sclera: Conjunctivae normal.     Pupils: Pupils are equal, round, and reactive to light.  Neck:     Musculoskeletal: Neck supple.     Thyroid: No thyromegaly.     Vascular: No JVD.     Trachea: No tracheal deviation.  Cardiovascular:     Rate and Rhythm: Normal rate and regular rhythm.     Heart sounds: No friction rub. No gallop.      Comments: 2/6 SM as usual  Pulmonary:     Effort: Pulmonary effort is normal. No respiratory distress.     Breath sounds: Normal breath sounds. No wheezing or rales.  Chest:     Chest wall: No tenderness.  Abdominal:     General: Bowel sounds are normal. There is no distension.     Palpations: Abdomen is soft. There is no mass.     Tenderness: There is no abdominal tenderness. There is no guarding or rebound.  Genitourinary:   Penis: Normal. No tenderness.      Prostate: Normal.     Rectum: Normal. Guaiac result negative.  Musculoskeletal: Normal range of motion.        General: No tenderness.  Lymphadenopathy:     Cervical: No cervical adenopathy.  Skin:    General: Skin is warm and dry.     Coloration: Skin is not pale.     Findings: No erythema or rash.  Neurological:     Mental Status: He is alert and oriented to person, place, and time.     Cranial Nerves: No cranial nerve deficit.     Motor: No abnormal muscle tone.     Coordination: Coordination normal.     Deep Tendon Reflexes: Reflexes are normal and symmetric. Reflexes normal.  Psychiatric:        Behavior: Behavior normal.        Thought Content: Thought content normal.        Judgment: Judgment normal.           Assessment & Plan:  His HTN is stable. Get fasting labs to check lipids, A1c, etc. His neuropathy  is stable.  Alysia Penna, MD

## 2019-01-12 ENCOUNTER — Encounter: Payer: Self-pay | Admitting: Gastroenterology

## 2019-07-18 ENCOUNTER — Other Ambulatory Visit: Payer: Self-pay

## 2019-07-18 ENCOUNTER — Telehealth (INDEPENDENT_AMBULATORY_CARE_PROVIDER_SITE_OTHER): Payer: Medicare Other | Admitting: Family Medicine

## 2019-07-18 ENCOUNTER — Encounter: Payer: Self-pay | Admitting: Family Medicine

## 2019-07-18 ENCOUNTER — Telehealth: Payer: Self-pay | Admitting: Family Medicine

## 2019-07-18 DIAGNOSIS — E1140 Type 2 diabetes mellitus with diabetic neuropathy, unspecified: Secondary | ICD-10-CM | POA: Diagnosis not present

## 2019-07-18 MED ORDER — GLUCOSE BLOOD VI STRP: ORAL_STRIP | 3 refills | Status: AC

## 2019-07-18 MED ORDER — METFORMIN HCL 500 MG PO TABS: ORAL_TABLET | ORAL | 3 refills | Status: DC

## 2019-07-18 MED ORDER — ONETOUCH DELICA LANCETS 33G MISC: 1.0000 "application " | Freq: Every day | 3 refills | Status: AC

## 2019-07-18 NOTE — Telephone Encounter (Signed)
Medication refill: Metformin  Pharmacy: [facility]: [phone]

## 2019-07-18 NOTE — Telephone Encounter (Signed)
Patient scheduled telephone visit for medication refills for 07/18/19.

## 2019-07-18 NOTE — Progress Notes (Signed)
Virtual Visit via Telephone Note  I connected with the patient on 07/18/19 at  3:30 PM EDT by telephone and verified that I am speaking with the correct person using two identifiers.   I discussed the limitations, risks, security and privacy concerns of performing an evaluation and management service by telephone and the availability of in person appointments. I also discussed with the patient that there may be a patient responsible charge related to this service. The patient expressed understanding and agreed to proceed.  Location patient: home Location provider: work or home office Participants present for the call: patient, provider Patient did not have a visit in the prior 7 days to address this/these issue(s).   History of Present Illness: Here to follow up on diabetes. He feels great. His am fasting glucoses are always in the range of 90-110. He rides his stationary bike several days a week.    Observations/Objective: Patient sounds cheerful and well on the phone. I do not appreciate any SOB. Speech and thought processing are grossly intact. Patient reported vitals:  Assessment and Plan: Diabetes, stable. Meds were refilled. We plan to bring him in for a complete exam and fasting labs soon.  Alysia Penna, MD   Follow Up Instructions:     [phone] 11-20 [phone] 21-30 I did not refer this patient for an OV in the next 24 hours for this/these issue(s).  I discussed the assessment and treatment plan with the patient. The patient was provided an opportunity to ask questions and all were answered. The patient agreed with the plan and demonstrated an understanding of the instructions.   The patient was advised to call back or seek an in-person evaluation if the symptoms worsen or if the condition fails to improve as anticipated.  I provided 11 minutes of non-face-to-face time during this encounter.   Alysia Penna, MD

## 2019-09-27 ENCOUNTER — Other Ambulatory Visit: Payer: Self-pay

## 2019-09-28 ENCOUNTER — Ambulatory Visit (INDEPENDENT_AMBULATORY_CARE_PROVIDER_SITE_OTHER): Payer: Medicare Other | Admitting: Family Medicine

## 2019-09-28 ENCOUNTER — Encounter: Payer: Self-pay | Admitting: Family Medicine

## 2019-09-28 VITALS — BP 160/70 | HR 118 | Temp 98.7°F | Ht 68.75 in | Wt 158.6 lb

## 2019-09-28 DIAGNOSIS — N138 Other obstructive and reflux uropathy: Secondary | ICD-10-CM | POA: Diagnosis not present

## 2019-09-28 DIAGNOSIS — G629 Polyneuropathy, unspecified: Secondary | ICD-10-CM | POA: Diagnosis not present

## 2019-09-28 DIAGNOSIS — I10 Essential (primary) hypertension: Secondary | ICD-10-CM

## 2019-09-28 DIAGNOSIS — N401 Enlarged prostate with lower urinary tract symptoms: Secondary | ICD-10-CM

## 2019-09-28 DIAGNOSIS — E785 Hyperlipidemia, unspecified: Secondary | ICD-10-CM

## 2019-09-28 DIAGNOSIS — E1140 Type 2 diabetes mellitus with diabetic neuropathy, unspecified: Secondary | ICD-10-CM | POA: Diagnosis not present

## 2019-09-28 LAB — LIPID PANEL
Cholesterol: 149 mg/dL (ref 0–200)
HDL: 48.7 mg/dL (ref >39.00)
LDL Cholesterol: 90 mg/dL (ref 0–99)
NonHDL: 100.24
Total CHOL/HDL Ratio: 3
Triglycerides: 49 mg/dL (ref 0.0–149.0)
VLDL: 9.8 mg/dL (ref 0.0–40.0)

## 2019-09-28 LAB — CBC WITH DIFFERENTIAL/PLATELET
Basophils Absolute: 0.1 10*3/uL (ref 0.0–0.1)
Basophils Relative: 0.5 % (ref 0.0–3.0)
Eosinophils Absolute: 0.1 10*3/uL (ref 0.0–0.7)
Eosinophils Relative: 0.8 % (ref 0.0–5.0)
HCT: 37.5 % — ABNORMAL LOW (ref 39.0–52.0)
Hemoglobin: 12.5 g/dL — ABNORMAL LOW (ref 13.0–17.0)
Lymphocytes Relative: 11.8 % — ABNORMAL LOW (ref 12.0–46.0)
Lymphs Abs: 1.6 10*3/uL (ref 0.7–4.0)
MCHC: 33.3 g/dL (ref 30.0–36.0)
MCV: 87.6 fl (ref 78.0–100.0)
Monocytes Absolute: 1.8 10*3/uL — ABNORMAL HIGH (ref 0.1–1.0)
Monocytes Relative: 13.2 % — ABNORMAL HIGH (ref 3.0–12.0)
Neutro Abs: 10.2 10*3/uL — ABNORMAL HIGH (ref 1.4–7.7)
Neutrophils Relative %: 73.7 % (ref 43.0–77.0)
Platelets: 505 10*3/uL — ABNORMAL HIGH (ref 150.0–400.0)
RBC: 4.28 Mil/uL (ref 4.22–5.81)
RDW: 14.4 % (ref 11.5–15.5)
WBC: 13.8 10*3/uL — ABNORMAL HIGH (ref 4.0–10.5)

## 2019-09-28 LAB — TSH: TSH: 1.21 u[IU]/mL (ref 0.35–4.50)

## 2019-09-28 LAB — BASIC METABOLIC PANEL
BUN: 12 mg/dL (ref 6–23)
CO2: 30 mEq/L (ref 19–32)
Calcium: 9.3 mg/dL (ref 8.4–10.5)
Chloride: 99 mEq/L (ref 96–112)
Creatinine, Ser: 0.99 mg/dL (ref 0.40–1.50)
GFR: 88 mL/min (ref >60.00)
Glucose, Bld: 144 mg/dL — ABNORMAL HIGH (ref 70–99)
Potassium: 4.7 mEq/L (ref 3.5–5.1)
Sodium: 138 mEq/L (ref 135–145)

## 2019-09-28 LAB — HEMOGLOBIN A1C: Hgb A1c MFr Bld: 6.2 % (ref 4.6–6.5)

## 2019-09-28 LAB — HEPATIC FUNCTION PANEL
ALT: 11 U/L (ref 0–53)
AST: 15 U/L (ref 0–37)
Albumin: 4 g/dL (ref 3.5–5.2)
Alkaline Phosphatase: 79 U/L (ref 39–117)
Bilirubin, Direct: 0.1 mg/dL (ref 0.0–0.3)
Total Bilirubin: 0.6 mg/dL (ref 0.2–1.2)
Total Protein: 7.1 g/dL (ref 6.0–8.3)

## 2019-09-28 LAB — PSA: PSA: 3.62 ng/mL (ref 0.10–4.00)

## 2019-09-28 NOTE — Progress Notes (Signed)
Subjective:    Patient ID: Jose Fleming, male    DOB: 1940/11/22, 79 y.o.   MRN: [id number]  HPI Here to follow up on issues. He feels fine. He rdies his bicycle 2-3 days a week and he enjoys walking. His urinations and BMs are regular.    Review of Systems  Constitutional: Negative.   HENT: Negative.   Eyes: Negative.   Respiratory: Negative.   Cardiovascular: Negative.   Gastrointestinal: Negative.   Genitourinary: Negative.   Musculoskeletal: Negative.   Skin: Negative.   Neurological: Negative.   Psychiatric/Behavioral: Negative.        Objective:   Physical Exam Constitutional:      General: He is not in acute distress.    Appearance: Normal appearance. He is well-developed. He is not diaphoretic.  HENT:     Head: Normocephalic and atraumatic.     Right Ear: External ear normal.     Left Ear: External ear normal.     Nose: Nose normal.     Mouth/Throat:     Pharynx: No oropharyngeal exudate.  Eyes:     General: No scleral icterus.       Right eye: No discharge.        Left eye: No discharge.     Conjunctiva/sclera: Conjunctivae normal.     Pupils: Pupils are equal, round, and reactive to light.  Neck:     Thyroid: No thyromegaly.     Vascular: No JVD.     Trachea: No tracheal deviation.  Cardiovascular:     Rate and Rhythm: Normal rate and regular rhythm.     Heart sounds: Normal heart sounds. No murmur. No friction rub. No gallop.   Pulmonary:     Effort: Pulmonary effort is normal. No respiratory distress.     Breath sounds: Normal breath sounds. No wheezing or rales.  Chest:     Chest wall: No tenderness.  Abdominal:     General: Bowel sounds are normal. There is no distension.     Palpations: Abdomen is soft. There is no mass.     Tenderness: There is no abdominal tenderness. There is no guarding or rebound.  Genitourinary:    Penis: Normal. No tenderness.      Testes: Normal.     Prostate: Normal.     Rectum: Normal. Guaiac result negative.    Musculoskeletal:        General: No tenderness. Normal range of motion.     Cervical back: Neck supple.  Lymphadenopathy:     Cervical: No cervical adenopathy.  Skin:    General: Skin is warm and dry.     Coloration: Skin is not pale.     Findings: No erythema or rash.  Neurological:     Mental Status: He is alert and oriented to person, place, and time.     Cranial Nerves: No cranial nerve deficit.     Motor: No abnormal muscle tone.     Coordination: Coordination normal.     Deep Tendon Reflexes: Reflexes are normal and symmetric. Reflexes normal.  Psychiatric:        Behavior: Behavior normal.        Thought Content: Thought content normal.        Judgment: Judgment normal.           Assessment & Plan:  He seems to be doing well. HTN is stable. We will get fasting labs to check lipids, A1c, etc. He is fully vaccinated against Covid-19. Annie Main  Sarajane Jews, MD

## 2019-10-08 ENCOUNTER — Other Ambulatory Visit: Payer: Self-pay

## 2019-10-08 ENCOUNTER — Telehealth: Payer: Self-pay | Admitting: Family Medicine

## 2019-10-08 ENCOUNTER — Telehealth (INDEPENDENT_AMBULATORY_CARE_PROVIDER_SITE_OTHER): Payer: Medicare Other | Admitting: Family Medicine

## 2019-10-08 DIAGNOSIS — R5383 Other fatigue: Secondary | ICD-10-CM | POA: Diagnosis not present

## 2019-10-08 DIAGNOSIS — M791 Myalgia, unspecified site: Secondary | ICD-10-CM

## 2019-10-08 DIAGNOSIS — R509 Fever, unspecified: Secondary | ICD-10-CM

## 2019-10-08 NOTE — Telephone Encounter (Signed)
The patients wife called to see how long is it okay for the patient to be running a fever. She said that they seen Dr. Sarajane Jews on the 4th and on the 6th he started running a fever but nothing above 102 and body aches. She has been giving him tylenol. She was wanting to speak with a nurse so I transferred her to the triage nurse.  Waiting triage notes

## 2019-10-08 NOTE — Progress Notes (Signed)
Patient ID: Jose Fleming, male   DOB: 03-01-1941, 79 y.o.   MRN: [id number]  This visit type was conducted due to national recommendations for restrictions regarding the COVID-19 pandemic in an effort to limit this patient's exposure and mitigate transmission in our community.   Virtual Visit via Telephone Note  I connected with Jose Fleming on 10/08/19 at  2:30 PM EDT by telephone and verified that I am speaking with the correct person using two identifiers.   I discussed the limitations, risks, security and privacy concerns of performing an evaluation and management service by telephone and the availability of in person appointments. I also discussed with the patient that there may be a patient responsible charge related to this service. The patient expressed understanding and agreed to proceed.  Location patient: home Location provider: work or home office Participants present for the call: patient, provider Patient did not have a visit in the prior 7 days to address this/these issue(s).   History of Present Illness: Jose Fleming has history of diabetes and reported hypertension though not currently treated.  He states that for the past week he has had intermittent fever as high as 102.  Has had stiffness in his neck and shoulders and upper back as well as myalgias.  Some decreased appetite.  No sick contacts.  Just prior to onset of the symptoms he had labs here back on 4 June with white count of 13.8 thousand.  He has had previous Covid vaccine.  Denies any cough, dyspnea, nausea, vomiting, or any abdominal pain.  No skin rashes.  No major headaches.  No recent tick bites.  He has had occasional mild burning with urination but not consistently.  There has been no confusion.  Past Medical History:  Diagnosis Date  . Diabetes mellitus without complication ([place])   . History of stomach ulcers   . Hypertension    Past Surgical History:  Procedure Laterality Date  . COLONOSCOPY  05-07-13    per Dr. Sharlett Iles, adenomatous polyps, repeat in 5 yrs     reports that he quit smoking about 31 years ago. He has never used smokeless tobacco. He reports current alcohol use. He reports that he does not use drugs. family history includes CAD in an other family member; Colon cancer (age of onset: 77) in his brother; Diabetes in an other family member; Hyperlipidemia in an other family member; Hypertension in an other family member; Stroke in an other family member. Allergies  Allergen Reactions  . Penicillins Swelling      Observations/Objective: Patient sounds cheerful and well on the phone. I do not appreciate any SOB. Speech and thought processing are grossly intact. Patient reported vitals:  Assessment and Plan:  1 week history of intermittent reported fevers along with myalgias shoulder and upper back and neck.  Rule out polymyalgia rheumatica  -Feel that he needs further evaluation with some lab work including CBC, urinalysis, sed rate -Doubt Covid given symptoms above and also he has had prior vaccination.  No recent sick contacts.   Follow Up Instructions:  -As above   99441 5-10 99442 11-20 99443 21-30 I did not refer this patient for an OV in the next 24 hours for this/these issue(s).  I discussed the assessment and treatment plan with the patient. The patient was provided an opportunity to ask questions and all were answered. The patient agreed with the plan and demonstrated an understanding of the instructions.   The patient was advised to call back or  seek an in-person evaluation if the symptoms worsen or if the condition fails to improve as anticipated.  I provided 25 minutes of non-face-to-face time during this encounter.   Carolann Littler, MD

## 2019-10-09 ENCOUNTER — Encounter: Payer: Self-pay | Admitting: Family Medicine

## 2019-10-09 ENCOUNTER — Telehealth (INDEPENDENT_AMBULATORY_CARE_PROVIDER_SITE_OTHER): Payer: Medicare Other | Admitting: Family Medicine

## 2019-10-09 ENCOUNTER — Other Ambulatory Visit: Payer: Self-pay

## 2019-10-09 ENCOUNTER — Other Ambulatory Visit (INDEPENDENT_AMBULATORY_CARE_PROVIDER_SITE_OTHER): Payer: Medicare Other

## 2019-10-09 VITALS — BP 150/70 | HR 118 | Temp 98.5°F

## 2019-10-09 DIAGNOSIS — R509 Fever, unspecified: Secondary | ICD-10-CM

## 2019-10-09 DIAGNOSIS — M791 Myalgia, unspecified site: Secondary | ICD-10-CM

## 2019-10-09 LAB — POCT URINALYSIS DIPSTICK
Bilirubin, UA: POSITIVE
Blood, UA: NEGATIVE
Glucose, UA: POSITIVE — AB
Ketones, UA: NEGATIVE
Leukocytes, UA: NEGATIVE
Nitrite, UA: NEGATIVE
Protein, UA: POSITIVE — AB
Spec Grav, UA: 1.02 (ref 1.010–1.025)
Urobilinogen, UA: 1 E.U./dL
pH, UA: 5.5 (ref 5.0–8.0)

## 2019-10-09 LAB — CK: Total CK: 26 U/L (ref 7–232)

## 2019-10-09 LAB — SEDIMENTATION RATE: Sed Rate: >130 mm/hr — ABNORMAL HIGH (ref 0–20)

## 2019-10-09 NOTE — Telephone Encounter (Signed)
We are seeing him today

## 2019-10-09 NOTE — Progress Notes (Signed)
   Subjective:    Patient ID: Jose Fleming, male    DOB: 07-02-1940, 79 y.o.   MRN: [id number]  HPI Here for a week of muscular stiffness and aching, with fevers to 102 degrees, and decreased appetite. We saw him for a diabetes follow up on 09-28-19, and he felt fine that day. However we got some lab work that showed an elevated WBC to 13.8 and an elevated platelet count to 505. His A1c was stable at 6.2. Then the next day he began to feel bad with the muscles aches and fever. The fever persisted until yesterday morning, and now he has been afebrile for 24 hours. The muscle aches persist. He denies any nausea or vomiting or diarrhea or abdominal pain, but his appetite is down. No urinary symptoms. No recnet tick bites. No other family members are sick. He has been fully vaccinated against the Covid-19 virus.    Review of Systems  Constitutional: Positive for appetite change, fatigue and fever.  Respiratory: Negative.   Cardiovascular: Negative.   Gastrointestinal: Negative.   Genitourinary: Negative.   Musculoskeletal: Positive for myalgias.  Neurological: Negative.        Objective:   Physical Exam Constitutional:      Comments: He appears to be weak   Cardiovascular:     Rate and Rhythm: Regular rhythm. Tachycardia present.     Pulses: Normal pulses.     Heart sounds: Normal heart sounds.  Pulmonary:     Effort: Pulmonary effort is normal.     Breath sounds: Normal breath sounds.  Abdominal:     General: Abdomen is flat. Bowel sounds are normal. There is no distension.     Palpations: Abdomen is soft. There is no mass.     Tenderness: There is no abdominal tenderness. There is no guarding or rebound.     Hernia: No hernia is present.  Lymphadenopathy:     Cervical: No cervical adenopathy.  Skin:    Findings: No rash.  Neurological:     General: No focal deficit present.     Mental Status: He is alert and oriented to person, place, and time.           Assessment &  Plan:  He has had myalgias and fever for one week, though the fever seems to have resolved. Possible etiologies include PMR or a viral illness. He can take Tylenol for discomfort. Drink plenty of fluids. We will send him to the lab for a repeat CBC, ESR, etc.  Alysia Penna, MD

## 2019-10-10 ENCOUNTER — Other Ambulatory Visit: Payer: Self-pay

## 2019-10-10 ENCOUNTER — Telehealth: Payer: Self-pay

## 2019-10-10 LAB — CBC WITH DIFFERENTIAL/PLATELET
Basophils Absolute: 0.3 10*3/uL — ABNORMAL HIGH (ref 0.0–0.1)
Basophils Relative: 0.9 % (ref 0.0–3.0)
Eosinophils Absolute: 0.2 10*3/uL (ref 0.0–0.7)
Eosinophils Relative: 0.7 % (ref 0.0–5.0)
HCT: 31.9 % — ABNORMAL LOW (ref 39.0–52.0)
Hemoglobin: 10.7 g/dL — ABNORMAL LOW (ref 13.0–17.0)
Lymphocytes Relative: 4.7 % — ABNORMAL LOW (ref 12.0–46.0)
Lymphs Abs: 1.4 10*3/uL (ref 0.7–4.0)
MCHC: 33.6 g/dL (ref 30.0–36.0)
MCV: 85.9 fl (ref 78.0–100.0)
Monocytes Absolute: 1.8 10*3/uL — ABNORMAL HIGH (ref 0.1–1.0)
Monocytes Relative: 6.1 % (ref 3.0–12.0)
Neutro Abs: 25.4 10*3/uL — ABNORMAL HIGH (ref 1.4–7.7)
Neutrophils Relative %: 87.6 % — ABNORMAL HIGH (ref 43.0–77.0)
Platelets: 733 10*3/uL — ABNORMAL HIGH (ref 150.0–400.0)
RBC: 3.71 Mil/uL — ABNORMAL LOW (ref 4.22–5.81)
RDW: 15.1 % (ref 11.5–15.5)
WBC: 29 10*3/uL (ref 4.0–10.5)

## 2019-10-10 LAB — EPSTEIN-BARR VIRUS VCA, IGG: EBV VCA IgG: >750 U/mL — ABNORMAL HIGH

## 2019-10-10 LAB — RHEUMATOID FACTOR: Rheumatoid fact SerPl-aCnc: <14 IU/mL (ref <14)

## 2019-10-10 LAB — B. BURGDORFI ANTIBODIES: B burgdorferi Ab IgG+IgM: <0.9 index

## 2019-10-10 LAB — EPSTEIN-BARR VIRUS VCA, IGM: EBV VCA IgM: <36 U/mL

## 2019-10-10 MED ORDER — PREDNISONE 10 MG PO TABS
10.0000 mg | ORAL_TABLET | Freq: Two times a day (BID) | ORAL | 0 refills | Status: AC
Start: 2019-10-10 — End: ?

## 2019-10-10 NOTE — Telephone Encounter (Signed)
Jose Fleming from [place] lab called with a critical lab of 29.0 wbc

## 2019-10-10 NOTE — Telephone Encounter (Signed)
See my Result Note  

## 2020-04-30 ENCOUNTER — Telehealth: Payer: Self-pay | Admitting: Family Medicine

## 2020-04-30 NOTE — Telephone Encounter (Signed)
Tried calling patient to  schedule Medicare Annual Wellness Visit (AWV) either virtually or in office.  No answer    Last AWV ; please schedule at anytime with [facility] Nurse Health Advisor 1 or 2   This should be a 45 minute visit. 

## 2020-07-08 ENCOUNTER — Telehealth: Payer: Self-pay | Admitting: Family Medicine

## 2020-07-08 NOTE — Telephone Encounter (Signed)
Number on patient chart would not go through.  I called [phone] number on one of patient virtual appointment.    I asked step daughter to have patient to call office.  No detailed information given   please schedule Medicare Annual Wellness Visit (AWV) either virtually or in office.    Last AWV ;no information please schedule at anytime with [facility] Nurse Health Advisor 1 or 2   This should be a 45 minute visit.

## 2020-07-26 ENCOUNTER — Other Ambulatory Visit: Payer: Self-pay | Admitting: Family Medicine

## 2020-07-28 NOTE — Telephone Encounter (Signed)
Pt need a f/u visit for further refills

## 2020-11-15 ENCOUNTER — Other Ambulatory Visit: Payer: Self-pay | Admitting: Family Medicine

## 2020-12-10 DIAGNOSIS — Z0000 Encounter for general adult medical examination without abnormal findings: Secondary | ICD-10-CM | POA: Diagnosis not present

## 2020-12-10 DIAGNOSIS — R030 Elevated blood-pressure reading, without diagnosis of hypertension: Secondary | ICD-10-CM | POA: Diagnosis not present

## 2020-12-10 DIAGNOSIS — E1121 Type 2 diabetes mellitus with diabetic nephropathy: Secondary | ICD-10-CM | POA: Diagnosis not present

## 2020-12-19 ENCOUNTER — Telehealth: Payer: Self-pay | Admitting: Family Medicine

## 2020-12-19 NOTE — Telephone Encounter (Signed)
Left message for patient to call back and schedule Medicare Annual Wellness Visit (AWV) either virtually or in office. Left  my jabber number [phone]   AWV-I 04/26/09 per [facility]  Please schedule with [facility] 1 or 2 virtual or in office

## 2021-03-03 DIAGNOSIS — H25813 Combined forms of age-related cataract, bilateral: Secondary | ICD-10-CM | POA: Diagnosis not present

## 2021-03-03 DIAGNOSIS — E119 Type 2 diabetes mellitus without complications: Secondary | ICD-10-CM | POA: Diagnosis not present

## 2021-03-03 DIAGNOSIS — H43811 Vitreous degeneration, right eye: Secondary | ICD-10-CM | POA: Diagnosis not present

## 2021-03-03 DIAGNOSIS — H353131 Nonexudative age-related macular degeneration, bilateral, early dry stage: Secondary | ICD-10-CM | POA: Diagnosis not present

## 2021-03-03 DIAGNOSIS — H5203 Hypermetropia, bilateral: Secondary | ICD-10-CM | POA: Diagnosis not present

## 2022-06-25 ENCOUNTER — Other Ambulatory Visit: Payer: Self-pay

## 2022-07-01 ENCOUNTER — Other Ambulatory Visit: Payer: Self-pay

## 2022-07-01 ENCOUNTER — Telehealth: Payer: Self-pay

## 2022-07-01 NOTE — Telephone Encounter (Signed)
Our office has received patient supply testing form to complete, per Dr Sarajane Jews patient needs OV before completing the form, attempted to call pt for appointment no success in speaking or leaving a message for pt, pt phone number rings none sop with no answer. Will keep trying

## 2022-07-14 ENCOUNTER — Other Ambulatory Visit: Payer: Self-pay

## 2022-07-14 ENCOUNTER — Telehealth: Payer: Self-pay

## 2022-07-14 NOTE — Telephone Encounter (Signed)
Called and left a detailed message on pt spouse phone number, advised to call the office and schedule OV appointment for pt so Dr Sarajane Jews can complete the form from [employer]. Form is on Dr Osa Craver folder

## 2022-09-03 ENCOUNTER — Telehealth: Payer: Self-pay | Admitting: Family Medicine

## 2022-09-03 NOTE — Telephone Encounter (Signed)
We have not seen him in 3 years

## 2022-09-06 ENCOUNTER — Other Ambulatory Visit: Payer: Self-pay

## 2022-09-06 ENCOUNTER — Telehealth: Payer: Self-pay

## 2022-09-06 NOTE — Telephone Encounter (Signed)
Our office received a form from [facility] for pt testing supplies, form given to Dr Clent Ridges but per Dr Clent Ridges pt has not been seen since 2022. Left detailed message on pt spouse mobile number advising to contact the office and schedule OV appointment to discuss about the form
